# Patient Record
Sex: Female | Born: 1960 | Race: Black or African American | Hispanic: No | Marital: Single | Smoking: Former smoker
Health system: Southern US, Community
[De-identification: ages and names within clinical notes are randomized; demographics above are authoritative.]

## PROBLEM LIST (undated history)

## (undated) DIAGNOSIS — A4901 Methicillin susceptible Staphylococcus aureus infection, unspecified site: Secondary | ICD-10-CM

## (undated) DIAGNOSIS — R51 Headache: Secondary | ICD-10-CM

## (undated) DIAGNOSIS — I10 Essential (primary) hypertension: Secondary | ICD-10-CM

## (undated) DIAGNOSIS — E7800 Pure hypercholesterolemia, unspecified: Secondary | ICD-10-CM

## (undated) DIAGNOSIS — E11610 Type 2 diabetes mellitus with diabetic neuropathic arthropathy: Secondary | ICD-10-CM

## (undated) DIAGNOSIS — Z5189 Encounter for other specified aftercare: Secondary | ICD-10-CM

## (undated) DIAGNOSIS — D649 Anemia, unspecified: Secondary | ICD-10-CM

## (undated) HISTORY — DX: Essential (primary) hypertension: I10

## (undated) HISTORY — DX: Type 2 diabetes mellitus with diabetic neuropathic arthropathy: E11.610

## (undated) HISTORY — DX: Methicillin susceptible Staphylococcus aureus infection, unspecified site: A49.01

## (undated) HISTORY — DX: Anemia, unspecified: D64.9

## (undated) HISTORY — PX: TONSILLECTOMY: SUR1361

---

## 2004-10-16 ENCOUNTER — Emergency Department: Payer: Self-pay | Admitting: Emergency Medicine

## 2008-08-23 ENCOUNTER — Ambulatory Visit (HOSPITAL_COMMUNITY): Admission: RE | Admit: 2008-08-23 | Discharge: 2008-08-24 | Payer: Self-pay | Admitting: Ophthalmology

## 2009-10-04 ENCOUNTER — Ambulatory Visit (HOSPITAL_COMMUNITY): Admission: RE | Admit: 2009-10-04 | Discharge: 2009-10-04 | Payer: Self-pay | Admitting: Podiatry

## 2010-03-18 ENCOUNTER — Encounter: Payer: Self-pay | Admitting: Podiatry

## 2010-03-28 HISTORY — PX: FOOT SURGERY: SHX648

## 2010-06-04 LAB — BASIC METABOLIC PANEL
CO2: 26 mEq/L (ref 19–32)
Calcium: 9.6 mg/dL (ref 8.4–10.5)
GFR calc Af Amer: >60 mL/min (ref >60)
GFR calc non Af Amer: >60 mL/min (ref >60)
Glucose, Bld: 118 mg/dL — ABNORMAL HIGH (ref 70–99)
Potassium: 4.3 mEq/L (ref 3.5–5.1)
Sodium: 138 mEq/L (ref 135–145)

## 2010-06-04 LAB — GLUCOSE, CAPILLARY
Glucose-Capillary: 143 mg/dL — ABNORMAL HIGH (ref 70–99)
Glucose-Capillary: 171 mg/dL — ABNORMAL HIGH (ref 70–99)
Glucose-Capillary: 275 mg/dL — ABNORMAL HIGH (ref 70–99)

## 2010-06-04 LAB — CBC
HCT: 34 % — ABNORMAL LOW (ref 36.0–46.0)
Hemoglobin: 11.5 g/dL — ABNORMAL LOW (ref 12.0–15.0)
RBC: 3.77 MIL/uL — ABNORMAL LOW (ref 3.87–5.11)

## 2010-07-10 NOTE — Op Note (Signed)
Raven Watson, Raven Watson                ACCOUNT NO.:  [id number]   MEDICAL RECORD NO.:  [id number]          PATIENT TYPE:  OIB   LOCATION:  5127                         FACILITY:  [facility]   PHYSICIAN:  Beulah Gandy. Ashley Royalty, M.D. DATE OF BIRTH:  1960-10-06   DATE OF PROCEDURE:  08/23/2008  DATE OF DISCHARGE:                               OPERATIVE REPORT   ADMISSION DIAGNOSES:  Left eye proliferative diabetic retinopathy, right  eye proliferative diabetic retinopathy, vitreous hemorrhage, traction  retinal detachment.   PROCEDURES:  Left eye, panretinal photocoagulation with the indirect  ophthalmoscope laser; right eye, pars plana vitrectomy with membrane  peel; endocautery; gas fluid exchange; and retinal photocoagulation.   SURGEON:  Beulah Gandy. Ashley Royalty, MD   ASSISTANT:  Charyl Dancer, RN   ANESTHESIA:  General.   DETAILS:  After proper endotracheal anesthesia, attention was carried to  the left eye where 1226 laser burns were placed around the retinal  periphery.  The power was 460 milliwatts, 1000 microns each, and 0.1  seconds each.  The eye was treated with ointment and taped shut.  Attention was then carried to the right eye where usual prep and drape  was performed, 25-gauge trocars were placed at 8:10 and 2 o'clock.  Infusion at 8 o'clock.  Provisc placed on the corneal surface.  25-gauge  cutter and lighted pick were used to engage the vitreous and began  vitrectomy in the mid vitreous cavity.  Large clots of blood were  encountered, carefully removed under low suction and rapid cutting.  Membranes were extending from the disk nasally, temporally, and along  the upper arcade.  These membranes were incised with vitreous scissors  and removed with the vitreous cutter.  Forceps were used to grasp the  membrane and peel it from its attachments to the disk and the macula.  Once the entire membrane was removed, additional blood was vacuumed from  the surface of the retina.  The vitrectomy  was carried out to the  vitreous base for 360 degrees where additional cutting was performed to  shape the vitreous base.  Once the entire vitreous base was cleansed,  the endocautery was placed for hemostasis on several vitreoretinal  stumps.  Total hemostasis was obtained.  The extrusion cannula was used  to remove blood from the retinal surface.  The endolaser was positioned  in the eye, 1216 burns were placed around the retinal periphery.  The  power was 1000 milliwatts, 1000 microns each, and 0.1 seconds each.  A  30% gas-fluid exchange was then carried out.  The instruments were  removed from the eye, and the trocars were carefully removed.  The  wounds were tested and found to be tight.  Polymyxin and gentamicin were  irrigated at [place] space.  Atropine solution was applied, Decadron 10 mg  was injected to the lower subconjunctival space.  Polysporin ophthalmic  ointment, patch, and shield were placed.  The closing pressure was 10  with a Barraquer tonometer.  Complications, none.  Duration, 1 hour.  The patient was awakened and taken to recovery in satisfactory  condition.  Beulah Gandy. Ashley Royalty, M.D.  Electronically Signed    JDM/MEDQ  D:  08/23/2008  T:  08/24/2008  Job:  [id number]

## 2010-07-11 ENCOUNTER — Emergency Department (HOSPITAL_COMMUNITY)
Admission: EM | Admit: 2010-07-11 | Discharge: 2010-07-11 | Disposition: A | Discharge status: Home / Self Care | Payer: BC Managed Care – PPO | Attending: Emergency Medicine | Admitting: Emergency Medicine

## 2010-07-11 DIAGNOSIS — E785 Hyperlipidemia, unspecified: Secondary | ICD-10-CM | POA: Insufficient documentation

## 2010-07-11 DIAGNOSIS — Z7982 Long term (current) use of aspirin: Secondary | ICD-10-CM | POA: Insufficient documentation

## 2010-07-11 DIAGNOSIS — Z794 Long term (current) use of insulin: Secondary | ICD-10-CM | POA: Insufficient documentation

## 2010-07-11 DIAGNOSIS — E119 Type 2 diabetes mellitus without complications: Secondary | ICD-10-CM | POA: Insufficient documentation

## 2010-07-11 DIAGNOSIS — R071 Chest pain on breathing: Secondary | ICD-10-CM | POA: Insufficient documentation

## 2010-07-11 DIAGNOSIS — I10 Essential (primary) hypertension: Secondary | ICD-10-CM | POA: Insufficient documentation

## 2010-07-11 DIAGNOSIS — M25519 Pain in unspecified shoulder: Secondary | ICD-10-CM | POA: Insufficient documentation

## 2010-07-11 DIAGNOSIS — Z79899 Other long term (current) drug therapy: Secondary | ICD-10-CM | POA: Insufficient documentation

## 2010-08-26 ENCOUNTER — Inpatient Hospital Stay (HOSPITAL_COMMUNITY)
Admission: EM | Admit: 2010-08-26 | Discharge: 2010-08-31 | DRG: 415 | Disposition: A | Discharge status: Home Health | Payer: BC Managed Care – PPO | Source: Other Acute Inpatient Hospital | Attending: Internal Medicine | Admitting: Internal Medicine

## 2010-08-26 ENCOUNTER — Emergency Department (HOSPITAL_COMMUNITY): Payer: BC Managed Care – PPO

## 2010-08-26 ENCOUNTER — Emergency Department (HOSPITAL_COMMUNITY)
Admission: EM | Admit: 2010-08-26 | Discharge: 2010-08-26 | Disposition: A | Discharge status: Nursing Home (Includes ICF) | Payer: BC Managed Care – PPO | Source: Home / Self Care | Attending: Emergency Medicine | Admitting: Emergency Medicine

## 2010-08-26 ENCOUNTER — Inpatient Hospital Stay (HOSPITAL_COMMUNITY): Payer: BC Managed Care – PPO

## 2010-08-26 DIAGNOSIS — E785 Hyperlipidemia, unspecified: Secondary | ICD-10-CM | POA: Insufficient documentation

## 2010-08-26 DIAGNOSIS — A419 Sepsis, unspecified organism: Secondary | ICD-10-CM | POA: Insufficient documentation

## 2010-08-26 DIAGNOSIS — IMO0001 Reserved for inherently not codable concepts without codable children: Secondary | ICD-10-CM

## 2010-08-26 DIAGNOSIS — R112 Nausea with vomiting, unspecified: Secondary | ICD-10-CM | POA: Insufficient documentation

## 2010-08-26 DIAGNOSIS — D509 Iron deficiency anemia, unspecified: Secondary | ICD-10-CM | POA: Diagnosis present

## 2010-08-26 DIAGNOSIS — A4901 Methicillin susceptible Staphylococcus aureus infection, unspecified site: Secondary | ICD-10-CM

## 2010-08-26 DIAGNOSIS — N289 Disorder of kidney and ureter, unspecified: Secondary | ICD-10-CM | POA: Diagnosis present

## 2010-08-26 DIAGNOSIS — I10 Essential (primary) hypertension: Secondary | ICD-10-CM | POA: Insufficient documentation

## 2010-08-26 DIAGNOSIS — E871 Hypo-osmolality and hyponatremia: Secondary | ICD-10-CM | POA: Diagnosis present

## 2010-08-26 DIAGNOSIS — E1149 Type 2 diabetes mellitus with other diabetic neurological complication: Secondary | ICD-10-CM | POA: Diagnosis present

## 2010-08-26 DIAGNOSIS — Z7982 Long term (current) use of aspirin: Secondary | ICD-10-CM

## 2010-08-26 DIAGNOSIS — I319 Disease of pericardium, unspecified: Secondary | ICD-10-CM

## 2010-08-26 DIAGNOSIS — E1165 Type 2 diabetes mellitus with hyperglycemia: Secondary | ICD-10-CM | POA: Diagnosis present

## 2010-08-26 DIAGNOSIS — E119 Type 2 diabetes mellitus without complications: Secondary | ICD-10-CM | POA: Insufficient documentation

## 2010-08-26 DIAGNOSIS — E7800 Pure hypercholesterolemia, unspecified: Secondary | ICD-10-CM | POA: Diagnosis present

## 2010-08-26 DIAGNOSIS — A4101 Sepsis due to Methicillin susceptible Staphylococcus aureus: Principal | ICD-10-CM | POA: Diagnosis present

## 2010-08-26 DIAGNOSIS — R609 Edema, unspecified: Secondary | ICD-10-CM

## 2010-08-26 DIAGNOSIS — I959 Hypotension, unspecified: Secondary | ICD-10-CM | POA: Diagnosis present

## 2010-08-26 DIAGNOSIS — Z794 Long term (current) use of insulin: Secondary | ICD-10-CM | POA: Insufficient documentation

## 2010-08-26 DIAGNOSIS — L03119 Cellulitis of unspecified part of limb: Secondary | ICD-10-CM | POA: Diagnosis present

## 2010-08-26 DIAGNOSIS — D649 Anemia, unspecified: Secondary | ICD-10-CM | POA: Insufficient documentation

## 2010-08-26 DIAGNOSIS — R5381 Other malaise: Secondary | ICD-10-CM | POA: Diagnosis present

## 2010-08-26 DIAGNOSIS — IMO0002 Reserved for concepts with insufficient information to code with codable children: Secondary | ICD-10-CM | POA: Diagnosis present

## 2010-08-26 DIAGNOSIS — Z5189 Encounter for other specified aftercare: Secondary | ICD-10-CM

## 2010-08-26 DIAGNOSIS — L02619 Cutaneous abscess of unspecified foot: Secondary | ICD-10-CM | POA: Diagnosis present

## 2010-08-26 DIAGNOSIS — M9080 Osteopathy in diseases classified elsewhere, unspecified site: Secondary | ICD-10-CM | POA: Diagnosis present

## 2010-08-26 DIAGNOSIS — R1310 Dysphagia, unspecified: Secondary | ICD-10-CM | POA: Diagnosis present

## 2010-08-26 DIAGNOSIS — Z79899 Other long term (current) drug therapy: Secondary | ICD-10-CM | POA: Insufficient documentation

## 2010-08-26 DIAGNOSIS — M869 Osteomyelitis, unspecified: Secondary | ICD-10-CM | POA: Diagnosis present

## 2010-08-26 HISTORY — DX: Encounter for other specified aftercare: Z51.89

## 2010-08-26 HISTORY — DX: Methicillin susceptible Staphylococcus aureus infection, unspecified site: A49.01

## 2010-08-26 HISTORY — DX: Reserved for inherently not codable concepts without codable children: IMO0001

## 2010-08-26 LAB — BASIC METABOLIC PANEL
CO2: 25 mEq/L (ref 19–32)
CO2: 25 mEq/L (ref 19–32)
Calcium: 11 mg/dL — ABNORMAL HIGH (ref 8.4–10.5)
Chloride: 104 mEq/L (ref 96–112)
Creatinine, Ser: 1.2 mg/dL — ABNORMAL HIGH (ref 0.50–1.10)
GFR calc non Af Amer: 49 mL/min — ABNORMAL LOW (ref >60)
Glucose, Bld: 163 mg/dL — ABNORMAL HIGH (ref 70–99)
Potassium: 3.8 mEq/L (ref 3.5–5.1)
Sodium: 136 mEq/L (ref 135–145)

## 2010-08-26 LAB — URINE MICROSCOPIC-ADD ON

## 2010-08-26 LAB — DIC (DISSEMINATED INTRAVASCULAR COAGULATION)PANEL
INR: 1.3 (ref 0.00–1.49)
Prothrombin Time: 16.4 seconds — ABNORMAL HIGH (ref 11.6–15.2)

## 2010-08-26 LAB — VITAMIN B12
Vitamin B-12: 463 pg/mL (ref 211–911)
Vitamin B-12: 320 pg/mL (ref 211–911)

## 2010-08-26 LAB — DIFFERENTIAL
Basophils Relative: 0 % (ref 0–1)
Eosinophils Relative: 0 % (ref 0–5)
Lymphocytes Relative: 6 % — ABNORMAL LOW (ref 12–46)
Monocytes Relative: 4 % (ref 3–12)
Neutrophils Relative %: 90 % — ABNORMAL HIGH (ref 43–77)
WBC Morphology: INCREASED

## 2010-08-26 LAB — CBC
Hemoglobin: 6 g/dL — CL (ref 12.0–15.0)
Hemoglobin: 6.4 g/dL — CL (ref 12.0–15.0)
MCHC: 30.6 g/dL (ref 30.0–36.0)
MCHC: 32.5 g/dL (ref 30.0–36.0)
RBC: 2.66 MIL/uL — ABNORMAL LOW (ref 3.87–5.11)
RBC: 2.62 MIL/uL — ABNORMAL LOW (ref 3.87–5.11)
WBC: 13.1 10*3/uL — ABNORMAL HIGH (ref 4.0–10.5)
WBC: 16.8 10*3/uL — ABNORMAL HIGH (ref 4.0–10.5)

## 2010-08-26 LAB — HEMOGLOBIN A1C
Hgb A1c MFr Bld: 12.6 % — ABNORMAL HIGH (ref <5.7)
Mean Plasma Glucose: 315 mg/dL — ABNORMAL HIGH (ref <117)

## 2010-08-26 LAB — IRON AND TIBC
Iron: <10 ug/dL — ABNORMAL LOW (ref 42–135)
UIBC: 187 ug/dL
UIBC: 156 ug/dL

## 2010-08-26 LAB — LACTATE DEHYDROGENASE
LDH: 249 U/L (ref 94–250)
LDH: 227 U/L (ref 94–250)

## 2010-08-26 LAB — CARDIAC PANEL(CRET KIN+CKTOT+MB+TROPI)
CK, MB: 0.8 ng/mL (ref 0.3–4.0)
Relative Index: INVALID (ref 0.0–2.5)
Total CK: 20 U/L (ref 7–177)
Troponin I: <0.3 ng/mL (ref <0.30)

## 2010-08-26 LAB — MRSA PCR SCREENING: MRSA by PCR: NEGATIVE

## 2010-08-26 LAB — CROSSMATCH
Antibody Screen: NEGATIVE
Unit division: 0

## 2010-08-26 LAB — FIBRINOGEN: Fibrinogen: 695 mg/dL — ABNORMAL HIGH (ref 204–475)

## 2010-08-26 LAB — HEPATIC FUNCTION PANEL
ALT: 11 U/L (ref 0–35)
AST: 20 U/L (ref 0–37)
Albumin: 2 g/dL — ABNORMAL LOW (ref 3.5–5.2)
Alkaline Phosphatase: 177 U/L — ABNORMAL HIGH (ref 39–117)
Total Bilirubin: 0.3 mg/dL (ref 0.3–1.2)

## 2010-08-26 LAB — URINALYSIS, ROUTINE W REFLEX MICROSCOPIC
Glucose, UA: >1000 mg/dL — AB
Leukocytes, UA: NEGATIVE
Protein, ur: 30 mg/dL — AB
Specific Gravity, Urine: <1.005 — ABNORMAL LOW (ref 1.005–1.030)
pH: 6 (ref 5.0–8.0)

## 2010-08-26 LAB — TSH: TSH: 0.774 u[IU]/mL (ref 0.350–4.500)

## 2010-08-26 LAB — GLUCOSE, CAPILLARY
Glucose-Capillary: 505 mg/dL — ABNORMAL HIGH (ref 70–99)
Glucose-Capillary: 531 mg/dL — ABNORMAL HIGH (ref 70–99)
Glucose-Capillary: 213 mg/dL — ABNORMAL HIGH (ref 70–99)

## 2010-08-26 LAB — FERRITIN: Ferritin: 1291 ng/mL — ABNORMAL HIGH (ref 10–291)

## 2010-08-26 LAB — PREPARE RBC (CROSSMATCH)

## 2010-08-26 LAB — SEDIMENTATION RATE: Sed Rate: >140 mm/hr — ABNORMAL HIGH (ref 0–22)

## 2010-08-26 LAB — FOLATE: Folate: 5.1 ng/mL

## 2010-08-26 LAB — POCT I-STAT 4, (NA,K, GLUC, HGB,HCT)

## 2010-08-26 LAB — APTT: aPTT: 35 seconds (ref 24–37)

## 2010-08-26 LAB — ABO/RH: ABO/RH(D): O POS

## 2010-08-26 LAB — PROTIME-INR
INR: 1.35 (ref 0.00–1.49)
Prothrombin Time: 16.9 seconds — ABNORMAL HIGH (ref 11.6–15.2)

## 2010-08-26 IMAGING — CR DG FOOT COMPLETE 3+V*R*
3 series · 3 of 3 positions shown · non-contrast
Comparison: [DATE]

CLINICAL DATA: Surgery in [REDACTED].  Nausea vomiting and diarrhea
for 3 days.  Draining foot for 1 month.  Elevated white blood cell
count.

RIGHT FOOT COMPLETE - 3+ VIEW

[view not recorded (1 of 3)]
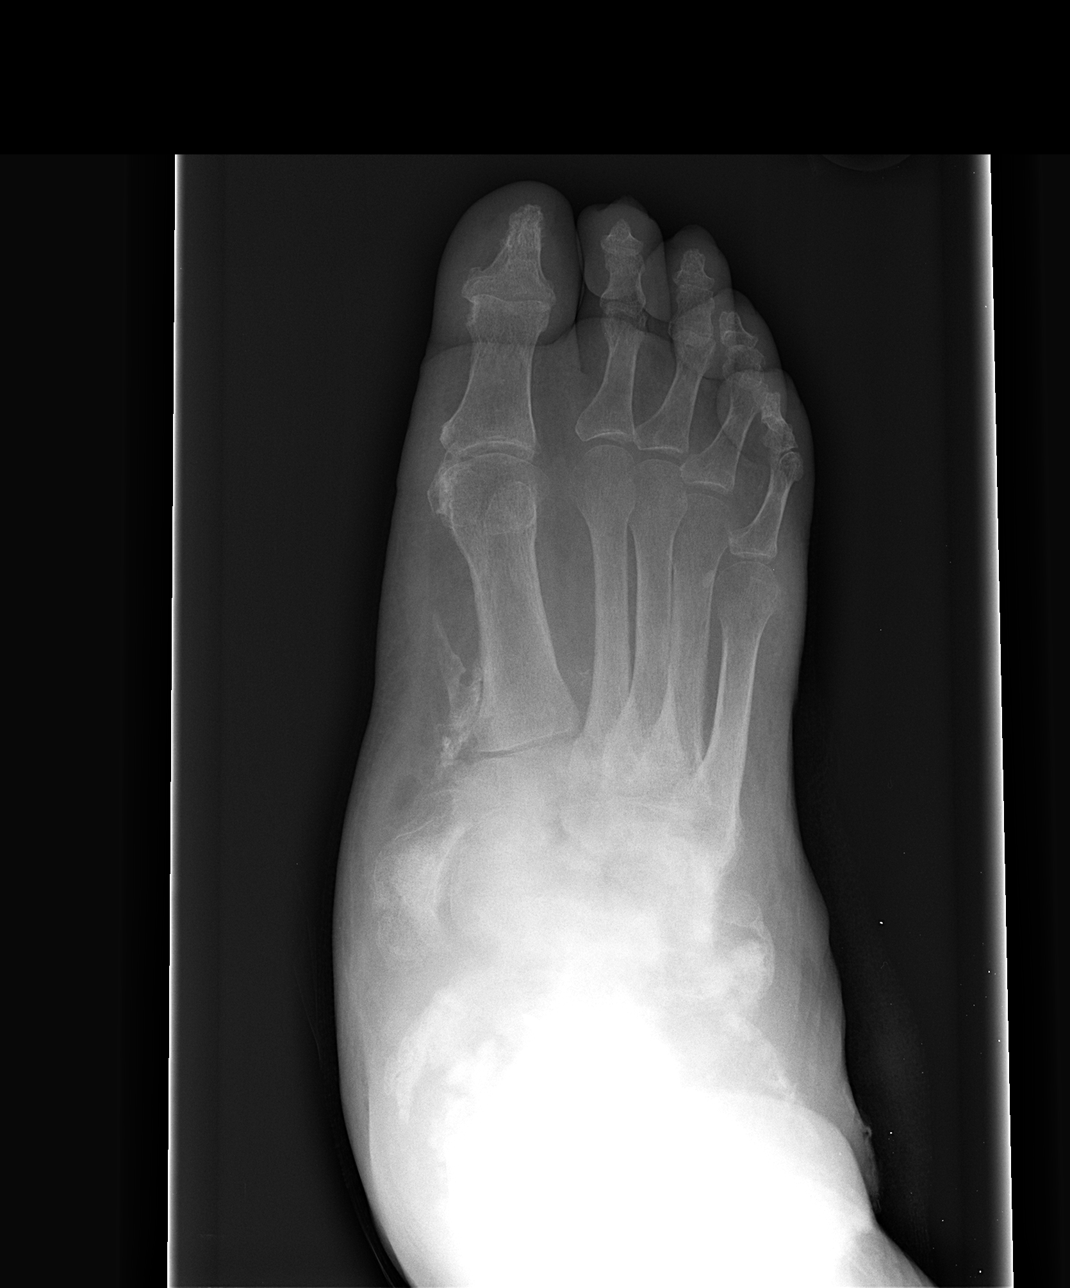

[view not recorded (2 of 3)]
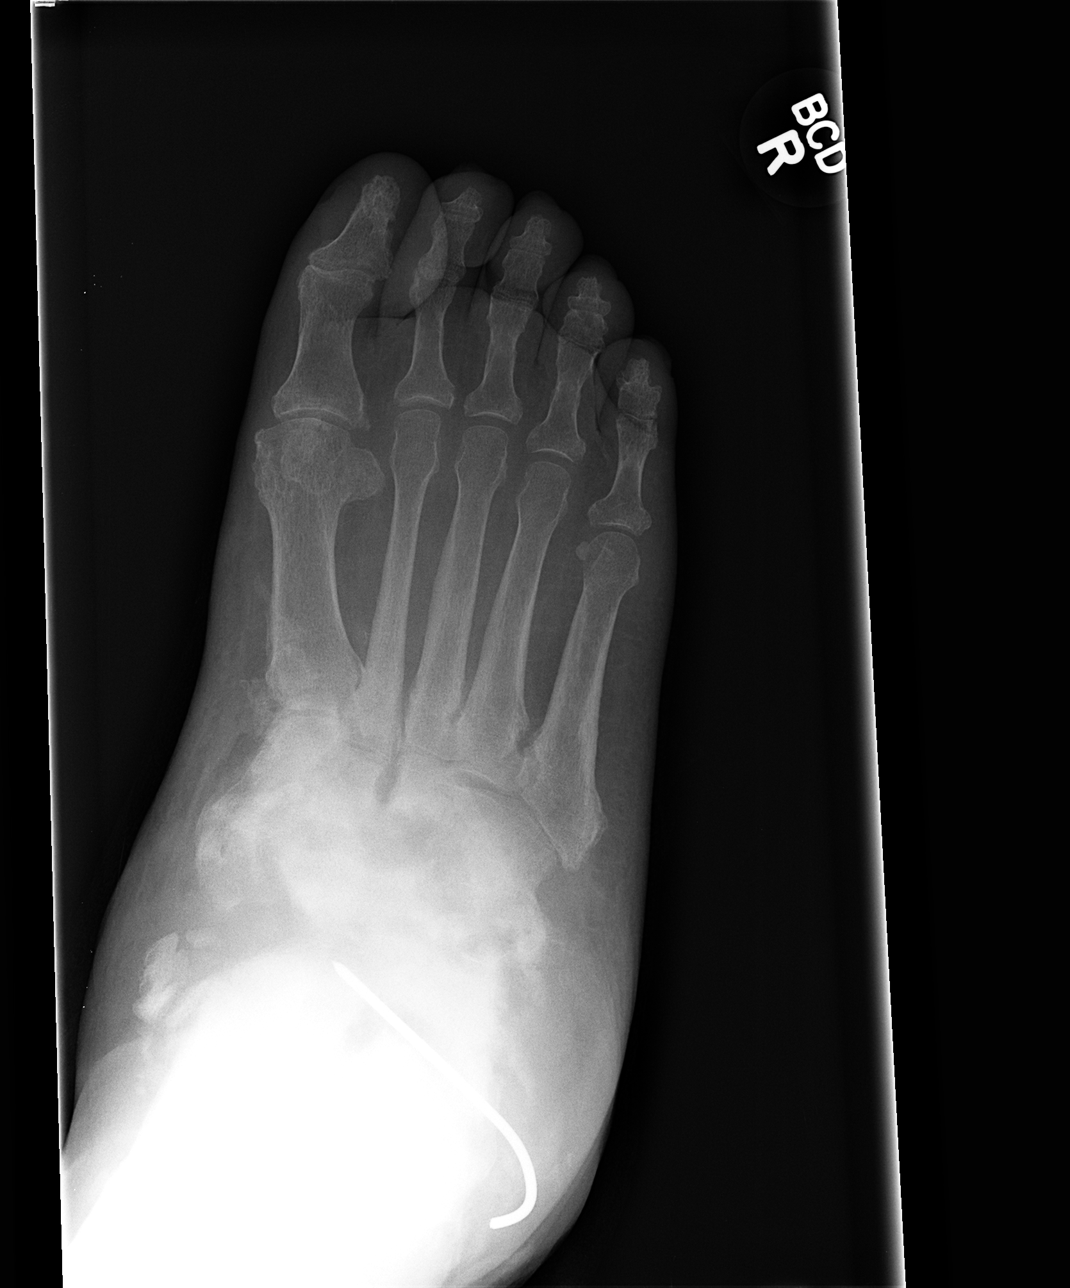

[view not recorded (3 of 3)]
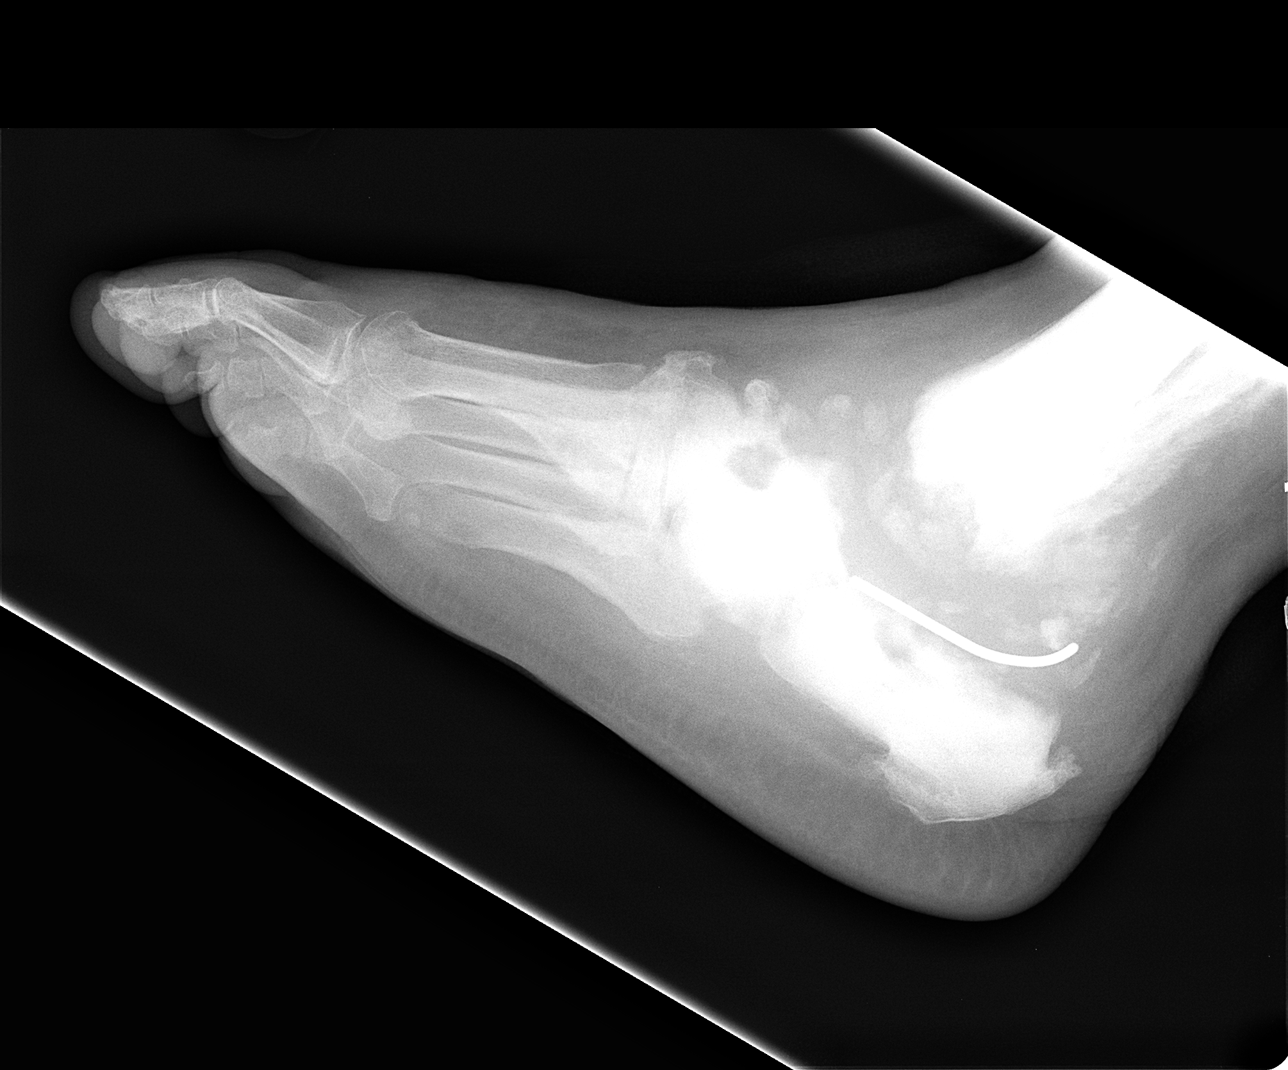

[3 of 3 positions shown; findings below may reference images not displayed]

FINDINGS: Diffuse osteopenia.  Extensive heterotopic ossification.
Diffuse soft tissue swelling.  Progression of charcot arthropathy
about the midfoot and hindfoot.  A surgical nail is identified
within the hind foot.  There is absence of bone in this area which
could represent  osteolysis.  There is diffuse sclerosis involving
the calcaneus, cuboid, and distal talus.  No focal soft tissue
defect identified.  No soft tissue gas.  No radiopaque foreign
object.
IMPRESSION: 1.  Diffuse soft tissue swelling without soft tissue gas or
radiopaque foreign object.
2.  Surgical changes in the hindfoot.  Combination of presumed
osteolysis and diffuse bony sclerosis is nonspecific.
Possibilities include progressive charcot arthropathy or
infection/osteomyelitis.  Contrast enhanced MRI should be
considered for further characterization.

## 2010-08-27 LAB — GLUCOSE, CAPILLARY
Glucose-Capillary: 564 mg/dL (ref 70–99)
Glucose-Capillary: 154 mg/dL — ABNORMAL HIGH (ref 70–99)
Glucose-Capillary: 347 mg/dL — ABNORMAL HIGH (ref 70–99)

## 2010-08-27 LAB — COMPREHENSIVE METABOLIC PANEL
AST: 67 U/L — ABNORMAL HIGH (ref 0–37)
Albumin: 1.4 g/dL — ABNORMAL LOW (ref 3.5–5.2)
Alkaline Phosphatase: 115 U/L (ref 39–117)
Chloride: 103 mEq/L (ref 96–112)
Potassium: 4.1 mEq/L (ref 3.5–5.1)
Total Bilirubin: 0.4 mg/dL (ref 0.3–1.2)

## 2010-08-27 LAB — CARDIAC PANEL(CRET KIN+CKTOT+MB+TROPI)
Relative Index: INVALID (ref 0.0–2.5)
Total CK: 18 U/L (ref 7–177)
Troponin I: <0.3 ng/mL (ref <0.30)

## 2010-08-27 LAB — CBC
HCT: 20.4 % — ABNORMAL LOW (ref 36.0–46.0)
Hemoglobin: 7 g/dL — ABNORMAL LOW (ref 12.0–15.0)
MCH: 26.1 pg (ref 26.0–34.0)
MCHC: 34.3 g/dL (ref 30.0–36.0)
MCV: 76.1 fL — ABNORMAL LOW (ref 78.0–100.0)
Platelets: 370 10*3/uL (ref 150–400)
RBC: 2.68 MIL/uL — ABNORMAL LOW (ref 3.87–5.11)
RDW: 18.4 % — ABNORMAL HIGH (ref 11.5–15.5)
WBC: 17.5 10*3/uL — ABNORMAL HIGH (ref 4.0–10.5)

## 2010-08-27 LAB — PREPARE RBC (CROSSMATCH)

## 2010-08-27 LAB — FOLATE RBC: RBC Folate: 700 ng/mL — ABNORMAL HIGH (ref >=366)

## 2010-08-27 LAB — TECHNOLOGIST SMEAR REVIEW: Path Review: INCREASED

## 2010-08-28 LAB — COMPREHENSIVE METABOLIC PANEL
AST: 49 U/L — ABNORMAL HIGH (ref 0–37)
Alkaline Phosphatase: 162 U/L — ABNORMAL HIGH (ref 39–117)
BUN: 30 mg/dL — ABNORMAL HIGH (ref 6–23)
CO2: 25 mEq/L (ref 19–32)
Chloride: 102 mEq/L (ref 96–112)
Creatinine, Ser: 0.95 mg/dL (ref 0.50–1.10)
GFR calc non Af Amer: >60 mL/min (ref >60)
Potassium: 3.8 mEq/L (ref 3.5–5.1)
Total Bilirubin: 0.3 mg/dL (ref 0.3–1.2)

## 2010-08-28 LAB — GLUCOSE, CAPILLARY
Glucose-Capillary: 101 mg/dL — ABNORMAL HIGH (ref 70–99)
Glucose-Capillary: 169 mg/dL — ABNORMAL HIGH (ref 70–99)
Glucose-Capillary: 139 mg/dL — ABNORMAL HIGH (ref 70–99)

## 2010-08-28 LAB — CBC
HCT: 21.9 % — ABNORMAL LOW (ref 36.0–46.0)
MCV: 77.4 fL — ABNORMAL LOW (ref 78.0–100.0)
RBC: 2.83 MIL/uL — ABNORMAL LOW (ref 3.87–5.11)
WBC: 16.7 10*3/uL — ABNORMAL HIGH (ref 4.0–10.5)

## 2010-08-28 LAB — HAPTOGLOBIN: Haptoglobin: 540 mg/dL — ABNORMAL HIGH (ref 30–200)

## 2010-08-28 LAB — PREPARE RBC (CROSSMATCH)

## 2010-08-29 LAB — CBC
HCT: 25.8 % — ABNORMAL LOW (ref 36.0–46.0)
MCHC: 32.9 g/dL (ref 30.0–36.0)
Platelets: 382 10*3/uL (ref 150–400)
RDW: 18.9 % — ABNORMAL HIGH (ref 11.5–15.5)
WBC: 17 10*3/uL — ABNORMAL HIGH (ref 4.0–10.5)

## 2010-08-29 LAB — CROSSMATCH
ABO/RH(D): O POS
Antibody Screen: NEGATIVE
Unit division: 0
Unit division: 0
Unit division: 0
Unit division: 0
Unit division: 0

## 2010-08-29 LAB — HAPTOGLOBIN: Haptoglobin: 573 mg/dL — ABNORMAL HIGH (ref 30–200)

## 2010-08-29 LAB — BASIC METABOLIC PANEL
BUN: 16 mg/dL (ref 6–23)
Chloride: 102 mEq/L (ref 96–112)
GFR calc Af Amer: >60 mL/min (ref >60)
GFR calc non Af Amer: >60 mL/min (ref >60)
Potassium: 3.7 mEq/L (ref 3.5–5.1)
Sodium: 133 mEq/L — ABNORMAL LOW (ref 135–145)

## 2010-08-29 LAB — WOUND CULTURE

## 2010-08-29 LAB — GLUCOSE, CAPILLARY
Glucose-Capillary: 128 mg/dL — ABNORMAL HIGH (ref 70–99)
Glucose-Capillary: 240 mg/dL — ABNORMAL HIGH (ref 70–99)

## 2010-08-30 DIAGNOSIS — R609 Edema, unspecified: Secondary | ICD-10-CM

## 2010-08-30 DIAGNOSIS — A4901 Methicillin susceptible Staphylococcus aureus infection, unspecified site: Secondary | ICD-10-CM

## 2010-08-30 DIAGNOSIS — D649 Anemia, unspecified: Secondary | ICD-10-CM

## 2010-08-30 DIAGNOSIS — M869 Osteomyelitis, unspecified: Secondary | ICD-10-CM

## 2010-08-30 DIAGNOSIS — E11610 Type 2 diabetes mellitus with diabetic neuropathic arthropathy: Secondary | ICD-10-CM

## 2010-08-30 DIAGNOSIS — B951 Streptococcus, group B, as the cause of diseases classified elsewhere: Secondary | ICD-10-CM

## 2010-08-30 HISTORY — DX: Anemia, unspecified: D64.9

## 2010-08-30 HISTORY — DX: Type 2 diabetes mellitus with diabetic neuropathic arthropathy: E11.610

## 2010-08-30 LAB — CBC
HCT: 28.1 % — ABNORMAL LOW (ref 36.0–46.0)
Hemoglobin: 9 g/dL — ABNORMAL LOW (ref 12.0–15.0)
MCHC: 32 g/dL (ref 30.0–36.0)
WBC: 16.2 10*3/uL — ABNORMAL HIGH (ref 4.0–10.5)

## 2010-08-30 LAB — GLUCOSE, CAPILLARY
Glucose-Capillary: 262 mg/dL — ABNORMAL HIGH (ref 70–99)
Glucose-Capillary: 98 mg/dL (ref 70–99)
Glucose-Capillary: 302 mg/dL — ABNORMAL HIGH (ref 70–99)
Glucose-Capillary: 299 mg/dL — ABNORMAL HIGH (ref 70–99)

## 2010-08-30 LAB — BASIC METABOLIC PANEL
Calcium: 8.8 mg/dL (ref 8.4–10.5)
GFR calc Af Amer: 60 mL/min — ABNORMAL LOW (ref >60)
GFR calc non Af Amer: 50 mL/min — ABNORMAL LOW (ref >60)
Potassium: 4 mEq/L (ref 3.5–5.1)
Sodium: 133 mEq/L — ABNORMAL LOW (ref 135–145)

## 2010-08-30 LAB — CULTURE, ROUTINE-ABSCESS

## 2010-08-31 LAB — GLUCOSE, CAPILLARY: Glucose-Capillary: 164 mg/dL — ABNORMAL HIGH (ref 70–99)

## 2010-08-31 NOTE — Op Note (Signed)
Raven Watson, Raven Watson                ACCOUNT NO.:  [id number]  MEDICAL RECORD NO.:  [id number]  LOCATION:  3315                         FACILITY:  [facility]  PHYSICIAN:  Vanita Panda. Magnus Ivan, M.D.DATE OF BIRTH:  03/11/1960  DATE OF PROCEDURE:  08/26/2010 DATE OF DISCHARGE:                              OPERATIVE REPORT   PREOPERATIVE DIAGNOSES: 1. Right foot and ankle abscess with overwhelming infection of soft     tissue, muscle, and bone. 2. Sepsis.  POSTOPERATIVE DIAGNOSES: 1. Right foot and ankle abscess with overwhelming infection of soft     tissue, muscle, and bone. 2. Sepsis.  PROCEDURE: 1. Irrigation and debridement of right foot and ankle abscess     including skin, soft tissue, tendon, muscle, and bone. 2. Removal of retained pin, right foot.  FINDINGS:  Large area gross purulence in reminiscence of right foot, tibia, talar and subtalar joint with gross purulence in necrotic soft tissue and bone, cultures pending. SURGEON:  Vanita Panda. Magnus Ivan, MD  ANESTHESIA:  Right popliteal block.  BLOOD LOSS:  100 mL.  COMPLICATIONS:  None.  INDICATIONS:  Ms. 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  This has been going on for some time.  She said some type of operation previously by her podiatrist back in February.  She then developed recent drainage from the right ankle wound with gross purulence.  Apparently she has been at [facility] for few days and then was transferred down here to the Hospitalist Service.  Orthopedic surgery was then consulted to address the foot.  X-rays of the foot compared to films from August of 2011 showed severe bony destruction all throughout the mid foot and hind foot.  Some of these are postsurgical changes and some of these are Charcot changes but overall it did show evidence of osteomyelitis as well and a retained pin.  Due to the grossly purulent drainage from her ankle and her  becoming septic appearing, we have elected to at least temporize things with opening her ankle up on the right side and washing out as best as possible.  I think at some point she will likely need a below-knee amputation.  PROCEDURE DESCRIPTION:  After informed consent was obtained, the appropriate right ankle was marked.  She was brought to the operating room and placed supine on the operating table.  Popliteal block was obtained and her right foot and ankle and leg were prepped and draped with Betadine scrub and paint.  Time-out was called to identify the correct patient and right foot and ankle.  I then made a incision around the previous incision where gross purulence was pouring out of the wound.  There was a large dead space in the foot for fluid collection over the large abscess.  Using a rongeur and a knife, I was able to dbride skin, soft tissue, tendon, muscle, and bone in this area.  I then used 6 liters of pulsatile lavage solution to completely lavage out this area.  I then loosely closed the skin with interrupted 2-0 nylon suture and placed Xeroform and well-padded compressive dressing with the understanding that she will continue  to accumulate fluid in this space. She was taken to the recovery room in stable condition. Postoperatively, they will hopefully be able to get her stabilize medically for potential further surgical intervention down the road which may even include a below-knee amputation.     Vanita Panda. Magnus Ivan, M.D.     CYB/MEDQ  D:  08/26/2010  T:  08/27/2010  Job:  [id number]  Electronically Signed by Doneen Poisson M.D. on 08/31/2010 06:30:43 PM

## 2010-09-01 LAB — CULTURE, BLOOD (ROUTINE X 2)
Culture  Setup Time: 201207011757
Culture  Setup Time: 201207011757
Culture: NO GROWTH
Culture: NO GROWTH

## 2010-09-01 LAB — ANAEROBIC CULTURE

## 2010-09-01 NOTE — Discharge Summary (Signed)
NAMEMADALINA, Watson NO.:  [id number]  MEDICAL RECORD NO.:  [id number]  LOCATION:  3315                         FACILITY:  [facility]  PHYSICIAN:  Isidor Holts, M.D.  DATE OF BIRTH:  [birth year]  DATE OF ADMISSION:  08/26/2010 DATE OF DISCHARGE:  08/30/2010                        DISCHARGE SUMMARY - REFERRING   PRIMARY MD:  Dr.  Wandra Watson, Family Medicine, [facility], [place], [place].  PRIMARY ENDOCRINOLOGIST:  Dr. Delano Watson at [facility].  PRIMARY PODIATRIST:  Dr. Cindra Watson at [facility], [address], [place], [place] [address].  DISCHARGE DIAGNOSES: 1. History of Charcot joint, right foot, status post surgery with thin     wire placement in February 2012. Hardware removed in April 2012. 2. MSSA/group B Streptococcus agalactiae infection/abscess and     osteomyelitis complicating #1. Status post I and D, with placement of antibiotic beads on      August 28, 2010. 3. Early sepsis, secondary to #2. 4. Severe microcytic anemia. Required transfusion 3 units of PRBC. 5. Hypertension. 6. Uncontrolled insulin-requiring type 2 diabetes mellitus.  DISCHARGE MEDICATIONS: 1. Ancef 2 g IV 8 hourly for 37 days, from August 31, 2010. 2. Hydrocodone/APAP (5/325 mg one tablet p.o. p.r.n. q.4 h.) for pain.     A total of #90 pills have been dispensed. 3. NovoLog insulin per sliding scale, as follows:  For CBG 70-120, no     insulin; CBG 121-150, 1 unit; CBG 151-200, 2 units; CBG 201-250, 3     units; CBG 251-300, 5 units; CBG 301-350, 7 units; CBG 351-400, 9 units. 4. Enalapril 10 mg p.o. daily (she was on 40 mg p.o. daily). 5. Lantus insulin 25 units, subcutaneously at bedtime. 6. Aspirin 81 mg p.o. daily. 7. Omeprazole 20 mg p.o. b.i.d. 8. Lovastatin 80 mg p.o. at bedtime. 9. Meloxicam 7.5 mg p.o. t.i.d.   Note: Atenolol and metformin have been discontinued.   PROCEDURES: 1. X-ray right  foot August 26, 2010.  This showed diffuse soft tissue     swelling without soft tissue gas or radiopaque foreign object,     surgical changes in the hindfoot, combination of presumed     osteomyelitis and diffuse bony sclerosis is nonspecific.     Possibilities include progressive sarcoid arthropathy or     infection/osteomyelitis. 2. Chest x-ray, August 26, 2010.  This was a mildly-to-moderately     degraded exam due to AP portable technique and patient's body     habitus.  However, there was cardiomegaly and low lung volumes.     Cannot exclude mild interstitial edema. 3. A 2-D echocardiogram, August 26, 2010.  This showed normal left     ventricular cavity size, wall thickness was increased in a pattern     of mild LVH.  Systolic function was normal.  Estimated ejection     fraction 60%-65%, left ventricular diastolic function parameters     were normal.  Right ventricular cavity size was mildly dilated,     wall thickness was normal.  Right atrium was mildly dilated and     pulmonary artery peak pressure 44 mmHg.  Pericardium showed  a small     free-flowing pericardial effusion posterior to the heart.  CONSULTATIONS: 1. Dr. Aldean Baker, Orthopedic surgeon. 2. Dr. Cliffton Asters, Infectious Disease specialist.  ADMISSION HISTORY:  As in H and P notes of August 26, 2010, dictated by Dr. Celso Watson.  However, in brief, this is a 50 year old female, with known history of insulin-requiring type 2 diabetes mellitus, hypertension, right ankle Charcot joint, who is status post surgery by podiatrist in 03/2010 with thin wire fixation and subsequently, status post hardware removal in 05/2010.  About 1 month ago, the patient developed drainage from the medial aspect of the affected joint, for which she was treated with a 1-week course of antibiotics with complete resolution.  A week later, she developed another drainage, this time, from the lateral aspect of the same ankle and was placed on  another week course of antibiotics.  This was completed a week prior to presentation. However, the drainage became more profuse and now appeared purulent.  In addition, the patient has experienced progressive weakness.  She presented to [facility], where she was evaluated by the Medical Hospitalist team and initial findings showed a WBC of 13.1, hemoglobin 6, platelets 617.  There were toxic granulations and bands on smear.  The patient had sinus tachycardia, borderline blood pressure 101/61 and a temperature of 101.2, BUN was 30, creatinine 1.2.  It was felt that the patient had infection, cellulitis/osteomyelitis of the infected foot and early sepsis syndrome.  She was therefore transferred to [facility] the same day, for further management.  CLINICAL COURSE: 1. Right foot infection, cellulitis, abscess/osteomyelitis of foot.     The patient was commenced on broad-spectrum antibiotic coverage     with a combination of vancomycin and Zosyn.  Orthopedic     consultation was kindly provided by Dr. Aldean Baker of [facility], and the patient underwent an I and D with placement of     antibiotic beads on August 28, 2010, in an uncomplicated procedure.     Subsequent abscess culture grew methicillin-sensitive     Staphylococcus aureus, as well as group B Streptococcus agalactiae.     Infectious Diseases consultation was kindly provided by Dr. Cliffton Asters.  She was recommended monotherapy with Ancef and a planned     total course of 42 days of antibiotic therapy.  The patient has     responded to above-mentioned management measures.  Subsequently, by     August 30, 2010, she felt considerably better.  She has had no     further episodes of pyrexia during the course of this     hospitalization.  2. Microcytic anemia.  The patient presented as described above, with a     hemoglobin of 6.0, MCV was 73.7, platelets were 617.  Smear showed     bandemia of 20%,  atypical lymphocytes, toxic granulation, mild left     shift.  DIC screen was negative.  Iron studies showed an iron level     of less than 10, percentage saturation was 156, vitamin B12 was     461, serum folate was 5.1, RBC folate was 700, and ferritin was     1079.  During the course of this hospitalization, the patient was     transfused a total of 3 units of PRBC, with a satisfactory bump in     hemoglobin to 9.0 on August 30, 2010.  3. Type 2 diabetes mellitus.  This is insulin requiring.  The patient     at presentation, was found to have a markedly elevated random blood     glucose of 630.  She was managed initially with intravenous insulin     infusion via glucostabilizer protocol, resulting in improvement     in glycemia to 163.  She was then transitioned to sliding scale     insulin coverage, carbohydrate-modified diet, as well as scheduled     Lantus insulin, with reasonable control.  Obviously, further     titration will be needed.  The patient has opined that she does     not want to be recommenced on oral hypoglycemic medications,     therefore she is to be managed with insulin monotherapy alone.     Of note, her hemoglobin A1c was 12.6.  4. Hypertension.  The patient's BP was borderline at the time of     initial presentation and as a matter of fact on arrival at [facility], she was actually mildly hypotensive with a BP of     96/60.  Her preadmission antihypertensive medications were placed     on hold.  She was managed on aggressive intravenous fluid hydration,     with satisfactory hemodynamic response.  Her BP has since     normalized, but she has remained normotensive, therefore we have     gingerly reintroduced lisinopril in a dose of 10 mg p.o. daily.     All other antihypertensive medications are still on hold, until     reevaluated by her primary MD.  5. Transient mild fluid overload.  The patient was found to be     somewhat "puffy" on August 29, 2010 with mild upper extremity and     lower extremity edema. Intravenous fluids were discontinued, and she     was managed with a single intravenous dose of Lasix, with resolution     of mild fluid overload.  DISPOSITION:  The patient on August 30, 2010, was considered clinically stable for discharge.  She did on August 29, 2010 have mild edema of her right upper extremity and because she had a PICC line in place in the upper extremity, this raised suspicion for possible DVT.  Venous Dopplers wer ordered, and showed no evidence of DVt, although  she did have a short segment of superficial thrombosis in her right antecubital vein.  ACTIVITY:  As tolerated.  Recommended no weightbearing on the right lower extremity.  DIET:  Heart-healthy/carbohydrate modified.  WOUND CARE:  Dressings as needed.  The patient is to utilize orthopedic boots 24 hours per day.  FOLLOW-UP INSTRUCTIONS:  The patient is to follow up with Dr. Lajoyce Corners, Orthopedic surgeon in 2 weeks postdischarge, telephone number [phone].  The patient is instructed to call for an appointment.  In addition, the patient will follow up with Dr. Cliffton Asters at the Infect[facility] Diseases [facility] on September 26, 2010 at 01:45 p.m.  An appointment has already been scheduled and the patient supplied with appropriate details.  SPECIAL INSTRUCTIONS:  Home health RN for wound care, antibiotic management, and diabetic care.  Home health PT and rolling walker have also been arranged. All this has been communicated to the patient and family.  They verbalized understanding.     Isidor Holts, M.D.     CO/MEDQ  D:  08/30/2010  T:  08/30/2010  Job:  [id number]  cc:   Nadara Mustard, MD Cliffton Asters,  M.D. Raven Mannan, MD Dr. Delano Watson Raven Watson, DPM  Electronically Signed by Isidor Holts M.D. on 09/01/2010 81:19:14 PM

## 2010-09-05 NOTE — Op Note (Signed)
  NAMEBAMA, HANSELMAN                ACCOUNT NO.:  [id number]  MEDICAL RECORD NO.:  [id number]  LOCATION:  3315                         FACILITY:  [facility]  PHYSICIAN:  Nadara Mustard, MD     DATE OF BIRTH:  April 25, 1960  DATE OF PROCEDURE:  08/28/2010 DATE OF DISCHARGE:                              OPERATIVE REPORT   PREOPERATIVE DIAGNOSES:  Abscess, osteomyelitis, right foot status post irrigation and debridement.  POSTOPERATIVE DIAGNOSES:  Abscess, osteomyelitis, right foot status post irrigation and debridement.  PROCEDURES:  Irrigation and debridement of skin, soft tissue, bone of the ankle and hindfoot with excision of bone and soft tissue and placement of antibiotic beads with vancomycin and gentamicin, 10 mL of stimulant beads.  SURGEON:  Nadara Mustard, MD  ANESTHESIA:  Local.  ESTIMATED BLOOD LOSS:  Minimal.  ANTIBIOTICS:  Vancomycin and Zosyn preoperatively.  DRAINS:  None.  COMPLICATIONS:  None.  TOURNIQUET TIME:  None.  DISPOSITION:  To PACU in stable condition.  INDICATION FOR PROCEDURE:  The patient is a 50 year old woman with diabetic insensate neuropathy status post a large infection of the hindfoot status post thin wire fixation for a Charcot foot without ulceration back in February.  She is status post irrigation and debridement with Dr. Magnus Ivan yesterday and returns today for repeat irrigation and debridement and placement of antibiotic beads.  Risks and benefits were discussed including persistent infection, neurovascular injury, nonhealing of the wound, need for potential amputation.  The patient states she understands and wished to proceed at this time.  DESCRIPTION OF PROCEDURE:  The patient was brought to the OR room #1 and underwent local anesthetic.  After adequate level of anesthesia was obtained, the patient's right lower extremity was prepped using the DuraPrep and draped into a sterile field.  The sutures were removed. The wound was  irrigated with pulsatile lavage.  Bone was curetted, debrided.  Soft tissue was also debrided back to bleeding, viable granulation tissue.  The patient had good granulation tissue and a good bleeding bed.  The stimulant 10 mL antibiotic beads were made with 500 mg of vancomycin and 240 mg of gentamicin.  The beads were placed within the void.  The incision was closed using 2-0 nylon with far-near-near- far suture technique.  The wound was covered with Adaptic, orthopedic sponges, ABD dressing, Kerlix, and Coban.  The patient was then taken to PACU in stable condition.  Plan for a fracture boot, nonweightbearing, on the right.  Anticipate 4-6 weeks of IV antibiotics pending the results of the cultures.  At that time, anticipate that the patient could undergo tibial-calcaneal fusion with an IM nail.     Nadara Mustard, MD     MVD/MEDQ  D:  08/28/2010  T:  08/29/2010  Job:  [id number]  Electronically Signed by Aldean Baker MD on 09/05/2010 09:40:32 AM

## 2010-09-06 NOTE — H&P (Addendum)
Raven Watson, Raven Watson                ACCOUNT NO.:  [id number]  MEDICAL RECORD NO.:  [id number]  LOCATION:  3315                         FACILITY:  [facility]  PHYSICIAN:  Celso Amy, MD   DATE OF BIRTH:  11/22/1960  DATE OF ADMISSION:  08/26/2010 DATE OF DISCHARGE:  LH                             HISTORY & PHYSICAL   PRIMARY CARE PHYSICIAN:  Dr. Angela Adam at [facility].  ENDOCRINOLOGIST:  Dr. [name] at [facility]  PEDIATRIST:  Dr. [name] at [facility].  CHIEF COMPLAINT:  Weakness.  HISTORY OF PRESENT ILLNESS:  The patient is a 50 year old African American female with a past medical history of diabetes type 2, hypertension who presented to the ER with a chief complaint of weakness. History of present illness dates back to this Friday when the patient started feeling extremely weak.  The patient was not able to move last night and that is the reason she presented to the ER.  The patient has had foot operated for Charcot joint in February.  The patient later had a brace on the foot for 46 weeks.  The patient developed drainage from the foot and was on 2 courses of antibiotics.  The patient finished her last course of antibiotics 1 week ago.  The patient later complained of having foul smelling discharge since Friday.  The discharge increases when the patient ambulates.  The patient also complained of nausea and vomiting since Friday.  No complaint of hematemesis.  No complaint of bright red blood per rectum.  No complaint of melena.  The patient complained of dizziness on postural change.  The patient developed fever in the ER.  She was afebrile at home.  The patient complained of chills and cough at home.  No complaint of syncope.  No complaint of any focal weakness.  No complaint of any speech difficulties or vision changes. No complaint of recent travel.  REVIEW OF SYSTEMS:  Negative besides the HPI.  ALLERGIES:  The patient has no known drug allergies.  SOCIAL  HISTORY:  The patient is nonsmoker, nondrinker and lives with a roommate.  FAMILY HISTORY:  The patient's mother is alive.  She lives in a nursing home and had a history of diabetes mellitus.  The patient is not sure about the father's medical history.  PAST MEDICAL HISTORY:  Positive for: 1. Hypertension. 2. Diabetes mellitus type 2. 3. Status post for Charcot foot surgery. 4. Hypercholesterolemia.  MEDICATIONS: 1. Atenolol 50 mg p.o. daily. 2. Metformin 1000 mg p.o. b.i.d. 3. Lovastatin 80 mg p.o. daily. 4. Enalapril 40 mg p.o. daily. 5. Gemfibrozil 600 mg p.o. b.i.d. 6. Aspirin 81 mg p.o. daily.  PHYSICAL EXAMINATION:  VITALS:  Blood pressure 101/61, pulse 108-120, respiratory rate 16, temperature 101.2. GENERAL:  The patient is awake, alert, oriented to time, place and person, is well built, well-built, well-nourished. HEENT:  Conjunctivae is pale.  Sclerae anicteric.  Pupils equally reactive to light and accommodation.  Extraocular movement is intact. Oral membranes are dry.  RESPIRATORY:  No acute respiratory distress. CHEST:  Clear to auscultation bilaterally. CARDIOVASCULAR:  S1, S2 is regular in rate and rhythm.  The patient was tachycardic. GI:  Bowel sounds  are present.  Abdomen is soft, nontender, nondistended. EXTREMITIES:  Right lower extremity has nonpitting edema.  Right foot has drainage from the lateral side.  The drainage is foul-smelling. NEUROLOGIC:  Cranial nerves II-XII are grossly intact. PSYCH:  The patient has normal mood and affect.  LABORATORY DATA:  Sodium 128, potassium 4.9, serum chloride 88, bicarb 25.  The patient's BUN is 30, creatinine is 1.2.  The patient's glucose is 630.  The patient's hemoglobin is 6, WBC 13.1, platelets 617,000. The patient has toxic granulation and also bands.  The patient has more than 20% bands in her CBC.  The patient's EKG showed normal sinus rhythm but the patient is tachycardic.  The patient's foot x-ray done  showed diffuse soft tissue swelling without soft tissue, gas, or radiopaque foreign objects.  It also showed progressive Charcot arthropathy or infection/osteomyelitis.  The patient had calcium of 11.  The patient's UA showed glucose of more than 1000.  IMPRESSION: 1. ID.  The patient is coming with fever and drainage of foot.  The     patient's differential include osteomyelitis/sepsis/cellulitis. 2. Anemia.  The patient is coming with hemoglobin of 6.  The patient's     anemia could be from:     a.     Hemolysis     b.     Anemia of chronic disease.     c.     Bleeding.     d.     Iron-deficiency.     e.     DIC. 3. Endo:  The patient is going with uncontrolled diabetes mellitus     type 2 with glucose of more than 600, but the patient's bicarb is     normal, so the patient is in nonketotic hyperglycemia. 4. Deep venous thrombosis.  The patient's one foot has edema more than     the other.  We will rule out deep venous thrombosis. 5. CVS:  The patient is hemodynamically fragile.  She is not     orthostatic but blood pressure is on the lower side and is     tachycardic. 6. Fluid, electrolyte, nutrition.  The patient is hyponatremic.  This     hyponatremia is most likely from hypovolemia and hyperglycemia. 7. The patient is hypercalcemic.  Hypercalcemia is most likely from     hypovolemia.  PLAN: 1. We will admit to Step-down Unit at [facility]. 2. We will start IV vanco and IV Zosyn for antibiotic coverage of     sepsis/osteomyelitis. 3. We will do cardiac enzymes to rule out MI. 4. We will start the patient on glucose stabilizer for her nonketotic     hyperglycemia. 5. We will check anemia profile. 6. We will check hemolytic profile. 7. We will ask for ortho consult/podiatry consults at [facility]. 8. The patient is being transfused 2 units of blood now.  We will type     and screen 2 more units as the patient might need more blood.     Celso Amy,  MD     MB/MEDQ  D:  08/26/2010  T:  08/27/2010  Job:  [id number]  Electronically Signed by Celso Amy M.D. on 09/06/2010 01:17:03 PM

## 2010-09-06 NOTE — H&P (Signed)
  NAMEAVALIN, BRILEY                ACCOUNT NO.:  [id number]  MEDICAL RECORD NO.:  [id number]  LOCATION:  [facility]                          FACILITY:  [facility]  PHYSICIAN:  Celso Amy, MD   DATE OF BIRTH:  June 03, 1960  DATE OF ADMISSION:  08/26/2010 DATE OF DISCHARGE:  LH                             HISTORY & PHYSICAL   PRIMARY CARE PHYSICIAN:  Dr. Angela Adam  at [facility].  PODIATRIST:  [name], DPM  ENDOCRINOLOGIST:  __________ at [facility]  Dictation ended at this point.     Celso Amy, MD     MB/MEDQ  D:  08/26/2010  T:  08/26/2010  Job:  [id number]  Electronically Signed by Celso Amy M.D. on 09/06/2010 01:16:04 PM

## 2010-09-18 DIAGNOSIS — A4901 Methicillin susceptible Staphylococcus aureus infection, unspecified site: Secondary | ICD-10-CM | POA: Insufficient documentation

## 2010-09-18 DIAGNOSIS — E11610 Type 2 diabetes mellitus with diabetic neuropathic arthropathy: Secondary | ICD-10-CM | POA: Insufficient documentation

## 2010-09-18 DIAGNOSIS — I10 Essential (primary) hypertension: Secondary | ICD-10-CM | POA: Insufficient documentation

## 2010-09-26 ENCOUNTER — Encounter: Payer: Self-pay | Admitting: Internal Medicine

## 2010-09-26 ENCOUNTER — Ambulatory Visit (INDEPENDENT_AMBULATORY_CARE_PROVIDER_SITE_OTHER): Payer: BC Managed Care – PPO | Admitting: Internal Medicine

## 2010-09-26 VITALS — Temp 97.5°F | Ht 68.0 in

## 2010-09-26 DIAGNOSIS — A4901 Methicillin susceptible Staphylococcus aureus infection, unspecified site: Secondary | ICD-10-CM

## 2010-09-26 NOTE — Progress Notes (Signed)
  Subjective:    Patient ID: Coralee Edberg, female    DOB: Aug 21, 1960, 50 y.o.   MRN: [id number]  HPI  Mrs. Lainez is in for her hospital followup visit today. She is a 50 year old diabetic with Charcot arthropathy who underwent right ankle surgery with hardware fixation in February. 2 months ago she began to have bleeding and drainage from her right foot and 2 wires migrated out on their own. Her podiatrist treated her with 2 short courses of an oral antibiotic but last week the drainage continued and began to have a foul odor so she was admitted on July 1. She underwent incision and drainage and had one remaining pin removed. She had a large abscess cavity with bony involvement. She underwent a second I&D on July 3 and had vancomycin and gentamicin impregnated beads placed. Abscess cultures have grown methicillin sensitive staph aureus and group B strep. Rare gram-negative rods were seen on Gram stain. She was discharged on IV Ancef and is now in her 48nd day of therapy. She has not had any problems with her Ancef for the PICC. She feels like her foot is doing better with decreased drainage, pain and swelling. She had noticed some small beads coming out of the wound but that has decreased dramatically over the last few weeks.    Review of Systems     Objective:   Physical Exam  Constitutional: No distress.  Musculoskeletal:       Her right arm PICC site looks good.  There is an incision over her right lateral ankle. Her sutures are in place. She has no cellulitis of her foot. She has some edema to mid shin. There is some brown drainage on the gauze dressings.          Assessment & Plan:

## 2010-09-26 NOTE — Assessment & Plan Note (Signed)
She seems to be doing better but obviously has a very complex deep foot infection. Her sedimentation rate been hospitalized was greater than 140. I will repeat it today and continue her IV Ancef. I will see her back next week and consider converting her to oral antibiotics at that time.

## 2010-09-27 LAB — C-REACTIVE PROTEIN: CRP: 0.3 mg/dL (ref <0.6)

## 2010-10-09 ENCOUNTER — Encounter: Payer: Self-pay | Admitting: Internal Medicine

## 2010-10-09 ENCOUNTER — Ambulatory Visit (INDEPENDENT_AMBULATORY_CARE_PROVIDER_SITE_OTHER): Payer: BC Managed Care – PPO | Admitting: Internal Medicine

## 2010-10-09 VITALS — BP 142/99 | HR 102 | Temp 97.7°F

## 2010-10-09 DIAGNOSIS — A4901 Methicillin susceptible Staphylococcus aureus infection, unspecified site: Secondary | ICD-10-CM

## 2010-10-09 MED ORDER — CEPHALEXIN 500 MG PO CAPS: 500.0000 mg | ORAL_CAPSULE | Freq: Four times a day (QID) | ORAL | Status: AC

## 2010-10-09 MED ORDER — CEPHALEXIN 500 MG PO CAPS: 500.0000 mg | ORAL_CAPSULE | Freq: Four times a day (QID) | ORAL | Status: DC

## 2010-10-09 NOTE — Progress Notes (Signed)
Order to remove PICC line obtained from Dr. Orvan Falconer. Pt. identified with name and date of birth. PICC dressing removed, site unremarkable. PICC line removed using sterile procedure @ 1230 pm. PICC length equal to that noted in pt's hospital chart of 40 cm. Sterile petroleum gauze + sterile 4X4 applied to PICC site, pressure applied for 10 minutes and covered with Medipore tape as a pressure dressing. Pt. instructed to limit use of arm for 1 hour. Pt. instructed that the pressure dressing should remain in place for 24 hours. Pt. verablized understanding of these instructions. Jennet Maduro, RN

## 2010-10-09 NOTE — Progress Notes (Signed)
  Subjective:    Patient ID: Raven Watson, female    DOB: [birth year], 50 y.o.   MRN: [id number]  HPI Raven Watson is in for her routine visit. She is now completed 44 days of IV Ancef for her severe right foot infection with methicillin sensitive staph aureus and group B strep. She has not had any trouble tolerating her PICC or the Ancef. She is still having some drainage from the right lateral foot the incision but states he incision is getting smaller and drainage is decreasing. At the time of her visit last week her sed rate had decreased from 140-51 and her C-reactive protein was now normal at 0.3.    Review of Systems     Objective:   Physical Exam  Constitutional: No distress.  Musculoskeletal:       Her right arm PICC site looks good.  There is an incision over her right lateral ankle. Her sutures have been removed. She has no cellulitis of her foot. She has some edema to mid shin. There is some green drainage on the gauze dressings. The wound is about 2-1/2 inches in length and about 1 inch wide. There is some yellow slough at the base of the wound. There is no exposed bone.          Assessment & Plan:

## 2010-10-09 NOTE — Assessment & Plan Note (Signed)
Her right foot osteomyelitis appears to be a little bit better but she still has a long way to go. I will stop her Ancef, remove the PICC, and continue her antibiotic therapy with oral Keflex.

## 2010-10-24 ENCOUNTER — Encounter (HOSPITAL_COMMUNITY)
Admission: RE | Admit: 2010-10-24 | Discharge: 2010-10-24 | Disposition: A | Discharge status: Home / Self Care | Payer: BC Managed Care – PPO | Source: Ambulatory Visit | Attending: Orthopedic Surgery | Admitting: Orthopedic Surgery

## 2010-10-24 ENCOUNTER — Other Ambulatory Visit (HOSPITAL_COMMUNITY): Payer: Self-pay | Admitting: Orthopedic Surgery

## 2010-10-24 ENCOUNTER — Ambulatory Visit (HOSPITAL_COMMUNITY)
Admission: RE | Admit: 2010-10-24 | Discharge: 2010-10-24 | Disposition: A | Discharge status: Home / Self Care | Payer: BC Managed Care – PPO | Source: Ambulatory Visit | Attending: Orthopedic Surgery | Admitting: Orthopedic Surgery

## 2010-10-24 DIAGNOSIS — J90 Pleural effusion, not elsewhere classified: Secondary | ICD-10-CM | POA: Insufficient documentation

## 2010-10-24 DIAGNOSIS — Z01812 Encounter for preprocedural laboratory examination: Secondary | ICD-10-CM | POA: Insufficient documentation

## 2010-10-24 DIAGNOSIS — M869 Osteomyelitis, unspecified: Secondary | ICD-10-CM | POA: Insufficient documentation

## 2010-10-24 DIAGNOSIS — Z01818 Encounter for other preprocedural examination: Secondary | ICD-10-CM | POA: Insufficient documentation

## 2010-10-24 DIAGNOSIS — I517 Cardiomegaly: Secondary | ICD-10-CM | POA: Insufficient documentation

## 2010-10-24 LAB — COMPREHENSIVE METABOLIC PANEL
ALT: 5 U/L (ref 0–35)
AST: 13 U/L (ref 0–37)
Albumin: 2.4 g/dL — ABNORMAL LOW (ref 3.5–5.2)
CO2: 28 mEq/L (ref 19–32)
Calcium: 10.2 mg/dL (ref 8.4–10.5)
Creatinine, Ser: 0.77 mg/dL (ref 0.50–1.10)
GFR calc non Af Amer: >60 mL/min (ref >60)
Sodium: 140 mEq/L (ref 135–145)
Total Protein: 6.3 g/dL (ref 6.0–8.3)

## 2010-10-24 LAB — APTT: aPTT: 41 seconds — ABNORMAL HIGH (ref 24–37)

## 2010-10-24 LAB — PROTIME-INR: INR: 1.02 (ref 0.00–1.49)

## 2010-10-24 LAB — CBC
MCV: 82.6 fL (ref 78.0–100.0)
Platelets: 379 10*3/uL (ref 150–400)
RBC: 4.88 MIL/uL (ref 3.87–5.11)
RDW: 17 % — ABNORMAL HIGH (ref 11.5–15.5)
WBC: 8.3 10*3/uL (ref 4.0–10.5)

## 2010-10-24 LAB — SURGICAL PCR SCREEN: Staphylococcus aureus: NEGATIVE

## 2010-10-30 ENCOUNTER — Ambulatory Visit (HOSPITAL_COMMUNITY)
Admission: RE | Admit: 2010-10-30 | Discharge: 2010-10-30 | Disposition: A | Discharge status: Home / Self Care | Payer: BC Managed Care – PPO | Source: Ambulatory Visit | Attending: Orthopedic Surgery | Admitting: Orthopedic Surgery

## 2010-10-30 DIAGNOSIS — M9080 Osteopathy in diseases classified elsewhere, unspecified site: Secondary | ICD-10-CM | POA: Insufficient documentation

## 2010-10-30 DIAGNOSIS — E1149 Type 2 diabetes mellitus with other diabetic neurological complication: Secondary | ICD-10-CM | POA: Insufficient documentation

## 2010-10-30 DIAGNOSIS — Z01810 Encounter for preprocedural cardiovascular examination: Secondary | ICD-10-CM | POA: Insufficient documentation

## 2010-10-30 DIAGNOSIS — E1169 Type 2 diabetes mellitus with other specified complication: Secondary | ICD-10-CM | POA: Insufficient documentation

## 2010-10-30 DIAGNOSIS — Z01812 Encounter for preprocedural laboratory examination: Secondary | ICD-10-CM | POA: Insufficient documentation

## 2010-10-30 DIAGNOSIS — E1142 Type 2 diabetes mellitus with diabetic polyneuropathy: Secondary | ICD-10-CM | POA: Insufficient documentation

## 2010-10-30 DIAGNOSIS — Z01818 Encounter for other preprocedural examination: Secondary | ICD-10-CM | POA: Insufficient documentation

## 2010-10-30 DIAGNOSIS — I10 Essential (primary) hypertension: Secondary | ICD-10-CM | POA: Insufficient documentation

## 2010-10-30 DIAGNOSIS — M869 Osteomyelitis, unspecified: Secondary | ICD-10-CM | POA: Insufficient documentation

## 2010-10-30 LAB — GLUCOSE, CAPILLARY: Glucose-Capillary: 74 mg/dL (ref 70–99)

## 2010-10-31 LAB — GLUCOSE, CAPILLARY: Glucose-Capillary: 89 mg/dL (ref 70–99)

## 2010-11-13 ENCOUNTER — Ambulatory Visit (INDEPENDENT_AMBULATORY_CARE_PROVIDER_SITE_OTHER): Payer: BC Managed Care – PPO | Admitting: Internal Medicine

## 2010-11-13 VITALS — BP 160/107 | HR 102 | Temp 97.9°F

## 2010-11-13 DIAGNOSIS — Z0000 Encounter for general adult medical examination without abnormal findings: Secondary | ICD-10-CM

## 2010-11-13 DIAGNOSIS — A4901 Methicillin susceptible Staphylococcus aureus infection, unspecified site: Secondary | ICD-10-CM

## 2010-11-13 DIAGNOSIS — Z23 Encounter for immunization: Secondary | ICD-10-CM

## 2010-11-13 LAB — SEDIMENTATION RATE: Sed Rate: 120 mm/hr — ABNORMAL HIGH (ref 0–22)

## 2010-11-13 LAB — C-REACTIVE PROTEIN: CRP: 0.42 mg/dL (ref <0.60)

## 2010-11-13 NOTE — Assessment & Plan Note (Addendum)
I will need to discuss the situation with Dr. Lajoyce Corners to determine the status of her infection. I will keep her off of antibiotic therapy for now and repeat her sedimentation rate and C-reactive protein. At the time of her last visit in mid August her sed rate had decreased from 140-51 and her C. reactive protein had normalized at 0.3.  Addendum: Her sed rate is elevated at 120 but her CRP remains normal at 0.48. I spoke with Dr. Lajoyce Corners who felt her foot looked good at the time of her most recent surgery. We agreed to continue observation off antibiotics. I called Ms. Molden and gave her this recommendation.

## 2010-11-13 NOTE — Progress Notes (Signed)
  Subjective:    Patient ID: Raven Watson, female    DOB: 1960-05-13, 50 y.o.   MRN: [id number]  HPI Raven Watson is in for her routine followup visit. She completed her Keflex last weekend which gave her a total of about 11 weeks of antibiotic therapy for her methicillin sensitive staph aureus right foot infection. She states that the drainage from her right foot had decreased but she underwent another surgery and had new antibiotic impregnated beads placed by Dr. Lajoyce Corners 2 weeks ago. She's not had any recent fevers or chills. I checked with our lab and did not find any new culture data.    Review of Systems     Objective:   Physical Exam  Constitutional: No distress.  Musculoskeletal:       There is an incision over her right lateral foot with bloody drainage on the dressings. There is no surrounding cellulitis.  Psychiatric: She has a normal mood and affect.          Assessment & Plan:

## 2010-11-14 ENCOUNTER — Encounter: Payer: Self-pay | Admitting: Internal Medicine

## 2010-11-14 NOTE — Progress Notes (Signed)
Addended by: Jennet Maduro D on: 11/14/2010 09:31 AM   Modules accepted: Orders

## 2010-11-15 NOTE — Op Note (Signed)
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  PROCEDURE: 1. Irrigation and debridement of skin, soft tissue and bone with     excision of bone from the hind foot including the calcaneus and     talus as well as a debridement of soft tissue from around the     wound. 2. Placement of antibiotic beads with vancomycin 1 g and gentamicin     240 mg. 3. Placement of ACell xenograft tissue graft.  SURGEON:  Nadara Mustard, MD  ANESTHESIA:  General.  ESTIMATED BLOOD LOSS:  Minimal.  ANTIBIOTICS:  Kefzol 2 g.  DRAINS:  None.  COMPLICATIONS:  None.  TOURNIQUET TIME:  None.  DISPOSITION:  PACU in stable condition.  INDICATIONS FOR PROCEDURE:  The patient is a 50 year old woman with diabetic insensate neuropathy Charcot collapse of right foot status post loosening off external fixation for which she developed a deep infection.  The patient has undergone serial wound debridements and presents at this time for repeat debridement and placement of antibiotic beads.  Risks and benefits of surgery were discussed including persistent infection, neurovascular injury, nonhealing of the wound, need for additional surgery.  The patient states she understands and wished to proceed at this time.  DESCRIPTION OF PROCEDURE:  The patient was brought to OR room 7 and underwent a general anesthetic.  After adequate level of anesthesia obtained, the patient's right lower extremity was  prepped using DuraPrep and draped into a sterile field.  Approximately, 3-mm around the wound edges was ellipsed out from the wound.  The wound is 5 cm x 3 cm and approximately 3 cm deep, this was debrided with curette of both the calcaneus and talus with further debridement through the midfoot. Fibrinous exudate of tissue was debrided.  There was no abscess, no purulence, no signs of any purulent infection.  After debridement of the skin, soft tissue and bone, pulsatile lavage was used, 3 liters of pulsatile lavage.  After thorough debridement, the bony void was then packed with the stimulant beads with 1 g of vancomycin and 240 mg of gentamicin.  The ACell tissue graft was then fenestrated on the back table.  This was all layered to be four layers thick and this was sutured in place for the wound and the wound was also sutured closed with a 2-0 nylon suture.  The wound was covered with Mepitel, Surg-I- Loop, 4x4s, ABD, Kerlix, and a Coban dressing.  The patient was extubated and taken to the PACU in stable condition.  The patient wishes for discharge to home.  She request no pain medication states that she only needs Tylenol for previous surgeries.  We will plan to  followup in the office in 1 week, minimize weightbearing on the right lower extremity.     Nadara Mustard, MD     MVD/MEDQ  D:  10/30/2010  T:  10/30/2010  Job:  [id number]  Electronically Signed by Aldean Baker MD on 11/15/2010 95:62:13 AM

## 2010-12-25 ENCOUNTER — Ambulatory Visit: Payer: BC Managed Care – PPO | Admitting: Internal Medicine

## 2011-02-04 ENCOUNTER — Encounter (HOSPITAL_COMMUNITY): Payer: Self-pay

## 2011-02-04 ENCOUNTER — Other Ambulatory Visit (HOSPITAL_COMMUNITY): Payer: Self-pay | Admitting: Orthopedic Surgery

## 2011-02-04 MED ORDER — CEFAZOLIN SODIUM 1-5 GM-% IV SOLN
1.0000 g | INTRAVENOUS | Status: DC
  Filled 2011-02-04: qty 50

## 2011-02-04 NOTE — Progress Notes (Signed)
Called Dr. Audrie Lia office, spoke with Elnita Maxwell, requested orders for surgery.

## 2011-02-05 ENCOUNTER — Encounter (HOSPITAL_COMMUNITY): Payer: Self-pay | Admitting: Surgery

## 2011-02-05 ENCOUNTER — Ambulatory Visit (HOSPITAL_COMMUNITY): Payer: Self-pay | Admitting: Vascular Surgery

## 2011-02-05 ENCOUNTER — Other Ambulatory Visit: Payer: Self-pay | Admitting: Orthopedic Surgery

## 2011-02-05 ENCOUNTER — Encounter (HOSPITAL_COMMUNITY)
Admission: RE | Disposition: A | Discharge status: Home / Self Care | Payer: Self-pay | Source: Ambulatory Visit | Attending: Orthopedic Surgery

## 2011-02-05 ENCOUNTER — Other Ambulatory Visit: Payer: Self-pay

## 2011-02-05 ENCOUNTER — Ambulatory Visit (HOSPITAL_COMMUNITY): Payer: Self-pay

## 2011-02-05 ENCOUNTER — Encounter (HOSPITAL_COMMUNITY): Payer: Self-pay | Admitting: Vascular Surgery

## 2011-02-05 ENCOUNTER — Inpatient Hospital Stay (HOSPITAL_COMMUNITY)
Admission: RE | Admit: 2011-02-05 | Discharge: 2011-02-08 | DRG: 617 | Disposition: A | Discharge status: Home / Self Care | Payer: Self-pay | Source: Ambulatory Visit | Attending: Orthopedic Surgery | Admitting: Orthopedic Surgery

## 2011-02-05 DIAGNOSIS — M9080 Osteopathy in diseases classified elsewhere, unspecified site: Secondary | ICD-10-CM | POA: Diagnosis present

## 2011-02-05 DIAGNOSIS — L97509 Non-pressure chronic ulcer of other part of unspecified foot with unspecified severity: Secondary | ICD-10-CM | POA: Diagnosis present

## 2011-02-05 DIAGNOSIS — Z794 Long term (current) use of insulin: Secondary | ICD-10-CM

## 2011-02-05 DIAGNOSIS — I10 Essential (primary) hypertension: Secondary | ICD-10-CM | POA: Diagnosis present

## 2011-02-05 DIAGNOSIS — I96 Gangrene, not elsewhere classified: Secondary | ICD-10-CM | POA: Diagnosis present

## 2011-02-05 DIAGNOSIS — E1149 Type 2 diabetes mellitus with other diabetic neurological complication: Secondary | ICD-10-CM | POA: Diagnosis present

## 2011-02-05 DIAGNOSIS — E1169 Type 2 diabetes mellitus with other specified complication: Principal | ICD-10-CM | POA: Diagnosis present

## 2011-02-05 DIAGNOSIS — M869 Osteomyelitis, unspecified: Secondary | ICD-10-CM | POA: Diagnosis present

## 2011-02-05 HISTORY — DX: Pure hypercholesterolemia, unspecified: E78.00

## 2011-02-05 HISTORY — DX: Headache: R51

## 2011-02-05 HISTORY — PX: AMPUTATION: SHX166

## 2011-02-05 HISTORY — DX: Encounter for other specified aftercare: Z51.89

## 2011-02-05 HISTORY — PX: LEG AMPUTATION BELOW KNEE: SHX694

## 2011-02-05 LAB — CBC
Hemoglobin: 11.8 g/dL — ABNORMAL LOW (ref 12.0–15.0)
MCH: 26.2 pg (ref 26.0–34.0)
MCHC: 33.4 g/dL (ref 30.0–36.0)
Platelets: 509 10*3/uL — ABNORMAL HIGH (ref 150–400)
RDW: 16.2 % — ABNORMAL HIGH (ref 11.5–15.5)

## 2011-02-05 LAB — COMPREHENSIVE METABOLIC PANEL
ALT: 7 U/L (ref 0–35)
Albumin: 2.4 g/dL — ABNORMAL LOW (ref 3.5–5.2)
Calcium: 9.9 mg/dL (ref 8.4–10.5)
GFR calc Af Amer: 78 mL/min — ABNORMAL LOW (ref >90)
Glucose, Bld: 135 mg/dL — ABNORMAL HIGH (ref 70–99)
Sodium: 138 mEq/L (ref 135–145)
Total Protein: 6.9 g/dL (ref 6.0–8.3)

## 2011-02-05 LAB — GLUCOSE, CAPILLARY: Glucose-Capillary: 81 mg/dL (ref 70–99)

## 2011-02-05 LAB — SURGICAL PCR SCREEN
MRSA, PCR: NEGATIVE
Staphylococcus aureus: NEGATIVE

## 2011-02-05 SURGERY — AMPUTATION BELOW KNEE
Anesthesia: General | Site: Leg Lower | Laterality: Right | Wound class: Clean

## 2011-02-05 MED ORDER — METHOCARBAMOL 100 MG/ML IJ SOLN: 500.0000 mg | Freq: Four times a day (QID) | INTRAVENOUS | Status: DC | PRN

## 2011-02-05 MED ORDER — PATIENT'S GUIDE TO USING COUMADIN BOOK
Freq: Once | Status: AC
  Administered 2011-02-05: 23:00:00
  Filled 2011-02-05: qty 1

## 2011-02-05 MED ORDER — ONDANSETRON HCL 4 MG PO TABS: 4.0000 mg | ORAL_TABLET | Freq: Four times a day (QID) | ORAL | Status: DC | PRN

## 2011-02-05 MED ORDER — INSULIN ASPART 100 UNIT/ML ~~LOC~~ SOLN
0.0000 [IU] | Freq: Three times a day (TID) | SUBCUTANEOUS | Status: DC
  Administered 2011-02-06: 3 [IU] via SUBCUTANEOUS
  Filled 2011-02-05: qty 3

## 2011-02-05 MED ORDER — MIDAZOLAM HCL 5 MG/5ML IJ SOLN
INTRAMUSCULAR | Status: DC | PRN
  Administered 2011-02-05: 2 mg via INTRAVENOUS

## 2011-02-05 MED ORDER — LACTATED RINGERS IV SOLN
INTRAVENOUS | Status: DC
  Administered 2011-02-05: 16:00:00 via INTRAVENOUS

## 2011-02-05 MED ORDER — HYDROMORPHONE HCL PF 1 MG/ML IJ SOLN
0.2500 mg | INTRAMUSCULAR | Status: DC | PRN
  Administered 2011-02-05 (×2): 0.5 mg via INTRAVENOUS

## 2011-02-05 MED ORDER — MUPIROCIN 2 % EX OINT: TOPICAL_OINTMENT | Freq: Once | CUTANEOUS | Status: DC

## 2011-02-05 MED ORDER — LACTATED RINGERS IV SOLN
INTRAVENOUS | Status: DC | PRN
  Administered 2011-02-05: 18:00:00 via INTRAVENOUS

## 2011-02-05 MED ORDER — OXYCODONE-ACETAMINOPHEN 5-325 MG PO TABS
1.0000 | ORAL_TABLET | ORAL | Status: DC | PRN
  Administered 2011-02-05 – 2011-02-07 (×6): 2 via ORAL
  Filled 2011-02-05 (×6): qty 2

## 2011-02-05 MED ORDER — FERROUS SULFATE 325 (65 FE) MG PO TABS
325.0000 mg | ORAL_TABLET | Freq: Three times a day (TID) | ORAL | Status: DC
  Administered 2011-02-06 – 2011-02-08 (×7): 325 mg via ORAL
  Filled 2011-02-05 (×10): qty 1

## 2011-02-05 MED ORDER — WARFARIN SODIUM 10 MG PO TABS
10.0000 mg | ORAL_TABLET | Freq: Once | ORAL | Status: AC
  Administered 2011-02-05: 10 mg via ORAL
  Filled 2011-02-05: qty 1

## 2011-02-05 MED ORDER — DOCUSATE SODIUM 100 MG PO CAPS
100.0000 mg | ORAL_CAPSULE | Freq: Two times a day (BID) | ORAL | Status: DC
  Administered 2011-02-06 – 2011-02-08 (×5): 100 mg via ORAL
  Filled 2011-02-05 (×5): qty 1

## 2011-02-05 MED ORDER — MUPIROCIN 2 % EX OINT
TOPICAL_OINTMENT | CUTANEOUS | Status: AC
  Filled 2011-02-05: qty 22

## 2011-02-05 MED ORDER — ONDANSETRON HCL 4 MG/2ML IJ SOLN: 4.0000 mg | Freq: Four times a day (QID) | INTRAMUSCULAR | Status: DC | PRN

## 2011-02-05 MED ORDER — INSULIN ASPART 100 UNIT/ML ~~LOC~~ SOLN
4.0000 [IU] | Freq: Three times a day (TID) | SUBCUTANEOUS | Status: DC
  Administered 2011-02-06 – 2011-02-07 (×5): 4 [IU] via SUBCUTANEOUS

## 2011-02-05 MED ORDER — HYDROMORPHONE HCL PF 1 MG/ML IJ SOLN
0.5000 mg | INTRAMUSCULAR | Status: DC | PRN
  Administered 2011-02-05: 1 mg via INTRAVENOUS
  Filled 2011-02-05: qty 1

## 2011-02-05 MED ORDER — INSULIN GLARGINE 100 UNIT/ML ~~LOC~~ SOLN
35.0000 [IU] | Freq: Every day | SUBCUTANEOUS | Status: DC
Start: 2011-02-05 — End: 2011-02-08
  Administered 2011-02-05 – 2011-02-07 (×3): 35 [IU] via SUBCUTANEOUS
  Filled 2011-02-05: qty 3

## 2011-02-05 MED ORDER — EPHEDRINE SULFATE 50 MG/ML IJ SOLN
INTRAMUSCULAR | Status: DC | PRN
  Administered 2011-02-05 (×2): 5 mg via INTRAVENOUS

## 2011-02-05 MED ORDER — CEFAZOLIN SODIUM-DEXTROSE 2-3 GM-% IV SOLR
2.0000 g | Freq: Four times a day (QID) | INTRAVENOUS | Status: AC
  Administered 2011-02-05 – 2011-02-06 (×3): 2 g via INTRAVENOUS
  Filled 2011-02-05 (×3): qty 50

## 2011-02-05 MED ORDER — SODIUM CHLORIDE 0.9 % IV SOLN
INTRAVENOUS | Status: DC
  Administered 2011-02-05: 20 mL/h via INTRAVENOUS

## 2011-02-05 MED ORDER — PROMETHAZINE HCL 25 MG/ML IJ SOLN
INTRAMUSCULAR | Status: AC
  Filled 2011-02-05: qty 1

## 2011-02-05 MED ORDER — WARFARIN VIDEO
Freq: Once | Status: AC
  Administered 2011-02-06: 10:00:00

## 2011-02-05 MED ORDER — GEMFIBROZIL 600 MG PO TABS
600.0000 mg | ORAL_TABLET | Freq: Two times a day (BID) | ORAL | Status: DC
  Administered 2011-02-06 – 2011-02-08 (×5): 600 mg via ORAL
  Filled 2011-02-05 (×7): qty 1

## 2011-02-05 MED ORDER — MEPERIDINE HCL 25 MG/ML IJ SOLN: 6.2500 mg | INTRAMUSCULAR | Status: DC | PRN

## 2011-02-05 MED ORDER — CEFAZOLIN SODIUM 1-5 GM-% IV SOLN
INTRAVENOUS | Status: DC | PRN
  Administered 2011-02-05: 2 g via INTRAVENOUS

## 2011-02-05 MED ORDER — HYDROCHLOROTHIAZIDE 12.5 MG PO CAPS
12.5000 mg | ORAL_CAPSULE | Freq: Every day | ORAL | Status: DC
  Administered 2011-02-05 – 2011-02-08 (×4): 12.5 mg via ORAL
  Filled 2011-02-05 (×4): qty 1

## 2011-02-05 MED ORDER — METOCLOPRAMIDE HCL 10 MG PO TABS: 5.0000 mg | ORAL_TABLET | Freq: Three times a day (TID) | ORAL | Status: DC | PRN

## 2011-02-05 MED ORDER — METOCLOPRAMIDE HCL 5 MG/ML IJ SOLN
5.0000 mg | Freq: Three times a day (TID) | INTRAMUSCULAR | Status: DC | PRN
  Filled 2011-02-05: qty 2

## 2011-02-05 MED ORDER — FENTANYL CITRATE 0.05 MG/ML IJ SOLN
INTRAMUSCULAR | Status: DC | PRN
  Administered 2011-02-05 (×2): 50 ug via INTRAVENOUS

## 2011-02-05 MED ORDER — CEFAZOLIN SODIUM 1-5 GM-% IV SOLN
INTRAVENOUS | Status: AC
  Filled 2011-02-05: qty 50

## 2011-02-05 MED ORDER — PROMETHAZINE HCL 25 MG/ML IJ SOLN
6.2500 mg | INTRAMUSCULAR | Status: AC | PRN
  Administered 2011-02-05 (×2): 0.625 mg via INTRAVENOUS

## 2011-02-05 MED ORDER — METHOCARBAMOL 500 MG PO TABS
500.0000 mg | ORAL_TABLET | Freq: Four times a day (QID) | ORAL | Status: DC | PRN
  Filled 2011-02-05: qty 1

## 2011-02-05 MED ORDER — HYDROCODONE-ACETAMINOPHEN 5-325 MG PO TABS
1.0000 | ORAL_TABLET | ORAL | Status: DC | PRN
  Administered 2011-02-07: 2 via ORAL
  Filled 2011-02-05: qty 2

## 2011-02-05 MED ORDER — SIMVASTATIN 5 MG PO TABS
5.0000 mg | ORAL_TABLET | Freq: Every day | ORAL | Status: DC
  Administered 2011-02-06: 5 mg via ORAL
  Filled 2011-02-05 (×2): qty 1

## 2011-02-05 MED ORDER — PROPOFOL 10 MG/ML IV BOLUS
INTRAVENOUS | Status: DC | PRN
  Administered 2011-02-05: 180 mg via INTRAVENOUS

## 2011-02-05 MED ORDER — INSULIN GLARGINE 100 UNIT/ML ~~LOC~~ SOLN: 35.0000 [IU] | Freq: Every day | SUBCUTANEOUS | Status: DC

## 2011-02-05 MED ORDER — ENALAPRIL MALEATE 10 MG PO TABS
10.0000 mg | ORAL_TABLET | Freq: Every day | ORAL | Status: DC
  Administered 2011-02-05 – 2011-02-08 (×4): 10 mg via ORAL
  Filled 2011-02-05 (×4): qty 1

## 2011-02-05 SURGICAL SUPPLY — 45 items
BANDAGE ESMARK 6X9 LF (GAUZE/BANDAGES/DRESSINGS) ×1 IMPLANT
BANDAGE GAUZE ELAST BULKY 4 IN (GAUZE/BANDAGES/DRESSINGS) ×8 IMPLANT
BLADE SAW RECIP 87.9 MT (BLADE) ×2 IMPLANT
BLADE SURG 21 STRL SS (BLADE) ×2 IMPLANT
BNDG COHESIVE 6X5 TAN STRL LF (GAUZE/BANDAGES/DRESSINGS) ×4 IMPLANT
BNDG ESMARK 6X9 LF (GAUZE/BANDAGES/DRESSINGS) ×2
CLOTH BEACON ORANGE TIMEOUT ST (SAFETY) ×2 IMPLANT
COVER SURGICAL LIGHT HANDLE (MISCELLANEOUS) ×2 IMPLANT
CUFF TOURNIQUET SINGLE 34IN LL (TOURNIQUET CUFF) ×2 IMPLANT
CUFF TOURNIQUET SINGLE 44IN (TOURNIQUET CUFF) IMPLANT
DRAIN PENROSE 1/2X12 LTX STRL (WOUND CARE) IMPLANT
DRAPE EXTREMITY T 121X128X90 (DRAPE) ×2 IMPLANT
DRAPE PROXIMA HALF (DRAPES) ×4 IMPLANT
DRAPE U-SHAPE 47X51 STRL (DRAPES) ×4 IMPLANT
DRSG ADAPTIC 3X8 NADH LF (GAUZE/BANDAGES/DRESSINGS) ×4 IMPLANT
DRSG PAD ABDOMINAL 8X10 ST (GAUZE/BANDAGES/DRESSINGS) ×8 IMPLANT
DURAPREP 26ML APPLICATOR (WOUND CARE) ×2 IMPLANT
ELECT REM PT RETURN 9FT ADLT (ELECTROSURGICAL) ×2
ELECTRODE REM PT RTRN 9FT ADLT (ELECTROSURGICAL) ×1 IMPLANT
EVACUATOR 1/8 PVC DRAIN (DRAIN) IMPLANT
GLOVE BIOGEL PI IND STRL 9 (GLOVE) ×1 IMPLANT
GLOVE BIOGEL PI INDICATOR 9 (GLOVE) ×1
GLOVE SURG ORTHO 9.0 STRL STRW (GLOVE) ×2 IMPLANT
GOWN PREVENTION PLUS XLARGE (GOWN DISPOSABLE) ×2 IMPLANT
GOWN SRG XL XLNG 56XLVL 4 (GOWN DISPOSABLE) ×1 IMPLANT
GOWN STRL NON-REIN XL XLG LVL4 (GOWN DISPOSABLE) ×1
KIT BASIN OR (CUSTOM PROCEDURE TRAY) ×2 IMPLANT
KIT ROOM TURNOVER OR (KITS) ×2 IMPLANT
MANIFOLD NEPTUNE II (INSTRUMENTS) ×2 IMPLANT
NS IRRIG 1000ML POUR BTL (IV SOLUTION) ×2 IMPLANT
PACK GENERAL/GYN (CUSTOM PROCEDURE TRAY) ×2 IMPLANT
PAD ARMBOARD 7.5X6 YLW CONV (MISCELLANEOUS) ×4 IMPLANT
SPONGE GAUZE 4X4 12PLY (GAUZE/BANDAGES/DRESSINGS) ×4 IMPLANT
SPONGE LAP 18X18 X RAY DECT (DISPOSABLE) ×2 IMPLANT
STAPLER VISISTAT 35W (STAPLE) ×2 IMPLANT
STOCKINETTE IMPERVIOUS LG (DRAPES) ×2 IMPLANT
SUT ETHILON 2 0 PSLX (SUTURE) ×2 IMPLANT
SUT PDS AB 1 CT  36 (SUTURE) ×2
SUT PDS AB 1 CT 36 (SUTURE) ×2 IMPLANT
SUT SILK 2 0 (SUTURE) ×1
SUT SILK 2-0 18XBRD TIE 12 (SUTURE) ×1 IMPLANT
TOWEL OR 17X24 6PK STRL BLUE (TOWEL DISPOSABLE) ×2 IMPLANT
TOWEL OR 17X26 10 PK STRL BLUE (TOWEL DISPOSABLE) ×2 IMPLANT
TUBE ANAEROBIC SPECIMEN COL (MISCELLANEOUS) IMPLANT
WATER STERILE IRR 1000ML POUR (IV SOLUTION) ×2 IMPLANT

## 2011-02-05 NOTE — Progress Notes (Signed)
Ekg from 09/2010 showed abnormality.  A. Zelnak,PA, notified.  She ordered EKG to be redone and then will review.//L. Agamjot Kilgallon,RN

## 2011-02-05 NOTE — Anesthesia Preprocedure Evaluation (Addendum)
Anesthesia Evaluation  Patient identified by MRN, date of birth, ID band Patient awake    Reviewed: Allergy & Precautions, H&P , NPO status , Patient's Chart, lab work & pertinent test results  Airway Mallampati: II TM Distance: >3 FB Neck ROM: Full    Dental  (+) Teeth Intact and Dental Advisory Given   Pulmonary  clear to auscultation        Cardiovascular hypertension, Pt. on medications Regular Normal    Neuro/Psych  Headaches,    GI/Hepatic   Endo/Other  Diabetes mellitus-, Well Controlled, Type 1, Insulin Dependent  Renal/GU   Genitourinary negative   Musculoskeletal negative musculoskeletal ROS (+)   Abdominal   Peds negative pediatric ROS (+)  Hematology negative hematology ROS (+)   Anesthesia Other Findings   Reproductive/Obstetrics negative OB ROS                          Anesthesia Physical Anesthesia Plan  ASA: III  Anesthesia Plan: General   Post-op Pain Management:    Induction: Intravenous  Airway Management Planned: LMA  Additional Equipment:   Intra-op Plan:   Post-operative Plan: Extubation in OR  Informed Consent: I have reviewed the patients History and Physical, chart, labs and discussed the procedure including the risks, benefits and alternatives for the proposed anesthesia with the patient or authorized representative who has indicated his/her understanding and acceptance.     Plan Discussed with: CRNA  Anesthesia Plan Comments:         Anesthesia Quick Evaluation

## 2011-02-05 NOTE — Preoperative (Signed)
Beta Blockers   Reason not to administer Beta Blockers:Not Applicable 

## 2011-02-05 NOTE — Anesthesia Procedure Notes (Addendum)
Procedure Name: LMA Insertion Date/Time: 02/05/2011 6:42 PM Performed by: Julianne Rice Z Pre-anesthesia Checklist: Patient identified, Timeout performed, Emergency Drugs available, Suction available and Patient being monitored Patient Re-evaluated:Patient Re-evaluated prior to inductionOxygen Delivery Method: Circle System Utilized Preoxygenation: Pre-oxygenation with 100% oxygen Intubation Type: IV induction Ventilation: Mask ventilation without difficulty LMA Size: 4.0 Tube type: Oral Number of attempts: 1 Placement Confirmation: breath sounds checked- equal and bilateral Tube secured with: Tape Dental Injury: Teeth and Oropharynx as per pre-operative assessment

## 2011-02-05 NOTE — H&P (Signed)
Raven Watson is an 50 y.o. female.   Chief Complaint: Ulcer osteomyelitis Charcot collapse right foot HPI: Patient is status post surgical intervention for Charcot collapse of the right foot. She subsequently underwent multiple foot salvage surgeries for infection in the bone and foot. Patient has shown progressive lytic changes in the hindfoot consistent with progressive Charcot changes osteomyelitis and varus deformity of her foot to the point where she does not have a foot which is able to bear weight for ambulation  Past Medical History  Diagnosis Date  . 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 Diabetes mellitus   . 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 Anemia 08/30/2010    microcytic anemia  . Hypertension   . Blood transfusion   . Headache     Past Surgical History  Procedure Date  . Foot surgery 03/2010    thin wire placement, Charcot joint    History reviewed. No pertinent family history. Social History:  reports that she has quit smoking. She has never used smokeless tobacco. She reports that she does not drink alcohol or use illicit drugs.  Allergies: No Known Allergies  Medications Prior to Admission  Medication Dose Route Frequency Provider Last Rate Last Dose  . ceFAZolin (ANCEF) IVPB 1 g/50 mL premix  1 g Intravenous 60 min Pre-Op Nadara Mustard, MD       Medications Prior to Admission  Medication Sig Dispense Refill  . aspirin 81 MG tablet Take 81 mg by mouth daily.        . enalapril (VASOTEC) 10 MG tablet Take 10 mg by mouth daily.        . insulin aspart (NOVOLOG) 100 UNIT/ML injection Inject 5 Units into the skin 3 (three) times daily before meals. Sliding scale      . insulin glargine (LANTUS) 100 UNIT/ML injection Inject 35 Units into the skin at bedtime.       . lovastatin (MEVACOR) 40 MG tablet Take 80 mg by mouth at  bedtime.          No results found for this or any previous visit (from the past 48 hour(s)). No results found.  Review of Systems  All other systems reviewed and are negative.    There were no vitals taken for this visit. Physical Exam  On examination patient has a palpable dorsalis pedis pulse she has a large ulcer over the lateral aspect of her foot. This does probe down to bone. The radiographs shows destructive lytic change lytic changes at the hindfoot consistent with osteomyelitis and Charcot collapse Assessment/Plan Assessment Charcot collapse osteomyelitis right foot with ulceration. Plan plan for transtibial amputation. Risks and benefits were discussed including infection neurovascular injury persistent pain nonhealing of the wound need for additional surgery patient states she understands and wishes to proceed at this time.  Bethany Hirt V 02/05/2011, 6:00 AM

## 2011-02-05 NOTE — H&P (Signed)
Raven Watson is an 50 y.o. female.   Chief Complaint: Osteomyelitis Charcot collapse Wagner grade 3 ulcer right foot  HPI: Patient has undergone prolonged foot salvage surgery for attempt at foot salvage on the right. She has had progressive Charcot collapse progressive lytic changes progressive ulceration presents at this time for a transtibial amputation.  Past Medical History  Diagnosis Date  . 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 Diabetes mellitus   . 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 Anemia 08/30/2010    microcytic anemia  . Hypertension   . Blood transfusion   . Headache     Past Surgical History  Procedure Date  . Foot surgery 03/2010    thin wire placement, Charcot joint    History reviewed. No pertinent family history. Social History:  reports that she has quit smoking. She has never used smokeless tobacco. She reports that she does not drink alcohol or use illicit drugs.  Allergies: No Known Allergies  Medications Prior to Admission  Medication Dose Route Frequency Provider Last Rate Last Dose  . ceFAZolin (ANCEF) IVPB 1 g/50 mL premix  1 g Intravenous 60 min Pre-Op Nadara Mustard, MD      . mupirocin ointment Idelle Jo) 2 %   Nasal Once Nadara Mustard, MD      . mupirocin ointment (BACTROBAN) 2 %            Medications Prior to Admission  Medication Sig Dispense Refill  . aspirin 81 MG tablet Take 81 mg by mouth daily.        . enalapril (VASOTEC) 10 MG tablet Take 10 mg by mouth daily.        . insulin aspart (NOVOLOG) 100 UNIT/ML injection Inject 5 Units into the skin 3 (three) times daily before meals. Sliding scale      . insulin glargine (LANTUS) 100 UNIT/ML injection Inject 35 Units into the skin at bedtime.       . lovastatin (MEVACOR) 40 MG tablet Take 80 mg by mouth at bedtime.          Results for orders  placed during the hospital encounter of 02/05/11 (from the past 48 hour(s))  APTT     Status: Abnormal   Collection Time   02/05/11  2:05 PM      Component Value Range Comment   aPTT 41 (*) 24 - 37 (seconds)   CBC     Status: Abnormal   Collection Time   02/05/11  2:05 PM      Component Value Range Comment   WBC 12.0 (*) 4.0 - 10.5 (K/uL)    RBC 4.50  3.87 - 5.11 (MIL/uL)    Hemoglobin 11.8 (*) 12.0 - 15.0 (g/dL)    HCT 45.4 (*) 09.8 - 46.0 (%)    MCV 78.4  78.0 - 100.0 (fL)    MCH 26.2  26.0 - 34.0 (pg)    MCHC 33.4  30.0 - 36.0 (g/dL)    RDW 11.9 (*) 14.7 - 15.5 (%)    Platelets 509 (*) 150 - 400 (K/uL)   COMPREHENSIVE METABOLIC PANEL     Status: Abnormal   Collection Time   02/05/11  2:05 PM      Component Value Range Comment   Sodium 138  135 - 145 (mEq/L)    Potassium 4.1  3.5 - 5.1 (mEq/L)  Chloride 103  96 - 112 (mEq/L)    CO2 24  19 - 32 (mEq/L)    Glucose, Bld 135 (*) 70 - 99 (mg/dL)    BUN 26 (*) 6 - 23 (mg/dL)    Creatinine, Ser 1.61  0.50 - 1.10 (mg/dL)    Calcium 9.9  8.4 - 10.5 (mg/dL)    Total Protein 6.9  6.0 - 8.3 (g/dL)    Albumin 2.4 (*) 3.5 - 5.2 (g/dL)    AST 18  0 - 37 (U/L)    ALT 7  0 - 35 (U/L)    Alkaline Phosphatase 206 (*) 39 - 117 (U/L)    Total Bilirubin 0.2 (*) 0.3 - 1.2 (mg/dL)    GFR calc non Af Amer 67 (*) >90 (mL/min)    GFR calc Af Amer 78 (*) >90 (mL/min)   PROTIME-INR     Status: Normal   Collection Time   02/05/11  2:05 PM      Component Value Range Comment   Prothrombin Time 14.4  11.6 - 15.2 (seconds)    INR 1.10  0.00 - 1.49    GLUCOSE, CAPILLARY     Status: Abnormal   Collection Time   02/05/11  2:09 PM      Component Value Range Comment   Glucose-Capillary 125 (*) 70 - 99 (mg/dL)    Dg Chest 2 View  09/60/4540  *RADIOLOGY REPORT*  Clinical Data: Preop for hypertension.  Diabetes.  Right foot surgery.  CHEST - 2 VIEW  Comparison: 10/24/2010  Findings: The small bilateral pleural effusions have resolved. Midline trachea.   Moderate enlargement of the cardiopericardial silhouette is not significantly changed. Mediastinal contours otherwise within normal limits.  No congestive failure.  No pneumothorax.  Biapical pleural thickening. Clear lungs.  IMPRESSION: Moderate cardiomegaly, without acute disease.  Original Report Authenticated By: Consuello Bossier, M.D.    Review of Systems  All other systems reviewed and are negative.    Blood pressure 124/81, pulse 100, temperature 97.9 F (36.6 C), temperature source Oral, resp. rate 20, height 5\' 8"  (1.727 m), weight 93.895 kg (207 lb), SpO2 98.00%. Physical Exam  On examination she has an ulcer which extends down to bone. Radiograph shows lytic changes of the tibial talar joint with essentially complete dissolution of the talus with Charcot lytic changes consistent with a Charcot arthropathy and osteomyelitis. 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 Plan we will plan for a transtibial amputation. Risks and benefits were discussed including infection neurovascular injury persistent pain need for additional surgery. Patient states he understands and wishes to proceed at this time.  [name] 02/05/2011, 3:49 PM

## 2011-02-05 NOTE — Transfer of Care (Signed)
Immediate Anesthesia Transfer of Care Note  Patient: Raven Watson  Procedure(s) Performed:  AMPUTATION BELOW KNEE - Right Below Knee Amputation  Patient Location: PACU  Anesthesia Type: General  Level of Consciousness: awake, alert  and oriented  Airway & Oxygen Therapy: Patient Spontanous Breathing and Patient connected to nasal cannula oxygen  Post-op Assessment: Report given to PACU RN and Post -op Vital signs reviewed and stable  Post vital signs: Reviewed and stable  Complications: No apparent anesthesia complications

## 2011-02-05 NOTE — Op Note (Signed)
OPERATIVE REPORT  DATE OF SURGERY: 02/05/2011  PATIENT:  Raven Watson,  50 y.o. female  PRE-OPERATIVE DIAGNOSIS:  Right Foot Osteomyelitis and Ulcer  POST-OPERATIVE DIAGNOSIS:  Right foot osteomyelitis and ulcer   PROCEDURE:  Procedure(s): AMPUTATION BELOW KNEE  SURGEON:  Surgeon(s): Nadara Mustard, MD  ANESTHESIA:   general  EBL:  min ML  SPECIMEN:  Source of Specimen:  right leg  TOURNIQUET: 7 min  PROCEDURE DETAILS: Patient is a 50 year old woman with diabetic insensate neuropathy status post Charcot collapse status post internal fixation for Charcot collapse he subsequently developed infection and has failed conservative treatment for wound management debridement of osteomyelitis and unstable Charcot collapse with a varus collapse of her foot. 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 Risks and benefits of surgery were discussed including infection neurovascular injury nonhealing of the wound need for higher level amputation patient states she understands and wishes to proceed at this time. Description of procedure patient brought to or room tendon underwent a general anesthetic. After adequate levels and anesthesia were obtained patient's right lower extremity was prepped using DuraPrep draped into a sterile field. A transverse incision was made 11 cm distal the tibial tubercle was curved proximally and a large posterior flap was created. The tibia was transected and beveled about 1 cm proximal to the skin incision the fibula was transected just proximal to the tibial incision. An amputation knife was used to create a large posterior flap. The sciatic nerve was pulled cut and allowed to retract. The vascular bundles were suture ligated with 2-0 silk. Tourniquet was deflated after 7 minutes hemostasis was obtained. The deep and superficial fascial layers were closed using #1  PDS. The skin was closed using approximate staples and 2-0 nylon. The wound is covered with Adaptic orthopedic sponges AB dressing Kerlix and Coban. Patient was extubated taken the PACU in stable condition.  PLAN OF CARE: Admit to inpatient   PATIENT DISPOSITION:  PACU - hemodynamically stable.   Nadara Mustard, MD 02/05/2011 7:25 PM

## 2011-02-05 NOTE — Progress Notes (Signed)
ANTICOAGULATION CONSULT NOTE - Initial Consult  Pharmacy Consult forWarfarin Indication: VTE prophylaxis  No Known Allergies  Patient Measurements: Height: 5\' 8"  (172.7 cm) Weight: 208 lb 5.4 oz (94.5 kg) IBW/kg (Calculated) : 63.9  Vital Signs: Temp: 98.4 F (36.9 C) (12/11 2138) Temp src: Oral (12/11 2138) BP: 158/83 mmHg (12/11 2138) Pulse Rate: 96  (12/11 2138)  Labs:  Basename 02/05/11 1405  HGB 11.8*  HCT 35.3*  PLT 509*  APTT 41*  LABPROT 14.4  INR 1.10  HEPARINUNFRC --  CREATININE 0.98  CKTOTAL --  CKMB --  TROPONINI --   Estimated Creatinine Clearance: 84.3 ml/min (by C-G formula based on Cr of 0.98).  Medical History: Past Medical History  Diagnosis Date  . 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 Diabetes mellitus   . 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 Anemia 08/30/2010    microcytic anemia  . Hypertension   . Blood transfusion   . Headache     Medications:  Prescriptions prior to admission  Medication Sig Dispense Refill  . aspirin 81 MG tablet Take 81 mg by mouth daily.        Marland Kitchen docusate sodium (COLACE) 100 MG capsule Take 100 mg by mouth 2 (two) times daily.        . enalapril (VASOTEC) 10 MG tablet Take 10 mg by mouth daily.        Marland Kitchen gemfibrozil (LOPID) 600 MG tablet Take 600 mg by mouth 2 (two) times daily before a meal.        . hydrochlorothiazide (MICROZIDE) 12.5 MG capsule Take 12.5 mg by mouth daily.        . insulin aspart (NOVOLOG) 100 UNIT/ML injection Inject 5 Units into the skin 3 (three) times daily before meals. Sliding scale      . insulin glargine (LANTUS) 100 UNIT/ML injection Inject 35 Units into the skin at bedtime.       . lovastatin (MEVACOR) 40 MG tablet Take 80 mg by mouth at bedtime.          Assessment: 50 year old woman s/p right foot amputation to start on Warfarin for VTE  prophylaxis. Goal of Therapy:  INR 2-3   Plan:  Warfarin 10mg  PO today.  Daily protimes.   Mickeal Skinner 02/05/2011,10:14 PM

## 2011-02-05 NOTE — Anesthesia Postprocedure Evaluation (Signed)
  Anesthesia Post-op Note  Patient: Raven Watson  Procedure(s) Performed:  AMPUTATION BELOW KNEE - Right Below Knee Amputation  Patient Location: PACU  Anesthesia Type: General  Level of Consciousness: awake, alert  and oriented  Airway and Oxygen Therapy: Patient Spontanous Breathing  Post-op Pain: mild  Post-op Assessment: Post-op Vital signs reviewed, Patient's Cardiovascular Status Stable, Respiratory Function Stable, Patent Airway, No signs of Nausea or vomiting and Pain level controlled  Post-op Vital Signs: stable  Complications: No apparent anesthesia complications

## 2011-02-06 ENCOUNTER — Encounter (HOSPITAL_COMMUNITY): Payer: Self-pay | Admitting: General Practice

## 2011-02-06 LAB — COMPREHENSIVE METABOLIC PANEL
ALT: 7 U/L (ref 0–35)
AST: 18 U/L (ref 0–37)
Alkaline Phosphatase: 158 U/L — ABNORMAL HIGH (ref 39–117)
Calcium: 9.4 mg/dL (ref 8.4–10.5)
GFR calc Af Amer: 73 mL/min — ABNORMAL LOW (ref >90)
Glucose, Bld: 205 mg/dL — ABNORMAL HIGH (ref 70–99)
Potassium: 4.2 mEq/L (ref 3.5–5.1)
Sodium: 136 mEq/L (ref 135–145)
Total Protein: 6.8 g/dL (ref 6.0–8.3)

## 2011-02-06 LAB — GLUCOSE, CAPILLARY
Glucose-Capillary: 112 mg/dL — ABNORMAL HIGH (ref 70–99)
Glucose-Capillary: 95 mg/dL (ref 70–99)

## 2011-02-06 LAB — PROTIME-INR: INR: 1.15 (ref 0.00–1.49)

## 2011-02-06 MED ORDER — WARFARIN SODIUM 10 MG PO TABS
10.0000 mg | ORAL_TABLET | Freq: Once | ORAL | Status: AC
  Administered 2011-02-06: 10 mg via ORAL
  Filled 2011-02-06: qty 1

## 2011-02-06 NOTE — Progress Notes (Signed)
Physical Therapy Evaluation Patient Details Name: Raven Watson MRN: [id number] DOB: [birth year] Today's Date: 02/06/2011  Problem List:  Patient Active Problem List  Diagnoses  . 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 MSSA (methicillin susceptible Staphylococcus aureus) infection  . Anemia  . Hypertension    Past Medical History:  Past Medical History  Diagnosis Date  . 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 Diabetes mellitus   . 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 Anemia 08/30/2010    microcytic anemia  . Hypertension   . Headache   . Hypercholesteremia   . Blood transfusion 08/2010   Past Surgical History:  Past Surgical History  Procedure Date  . Foot surgery 03/2010    thin wire placement, Charcot joint; right  . Leg amputation below knee 02/05/11    right  . Tonsillectomy     "when I was a small child"    PT Assessment/Plan/Recommendation PT Assessment Clinical Impression Statement: Pt is 50 yo female who has undergone Right BKA after multiple amputation surgeries to the right foot.  She is tolerating mobililty well but needs skilled PT to educate her on proper positioning and exercises to prepare the leg for a future prosthesis as well as help her safely accomodate to new weight differential.Recommend HHPT at d/c.  PT Recommendation/Assessment: Patient will need skilled PT in the acute care venue PT Problem List: Decreased balance;Decreased mobility;Decreased knowledge of precautions Barriers to Discharge: None PT Therapy Diagnosis : Difficulty walking;Abnormality of gait PT Plan PT Frequency: Min 5X/week PT Treatment/Interventions: DME instruction;Gait training;Therapeutic activities;Therapeutic exercise;Balance training;Patient/family education PT Recommendation Follow Up Recommendations: Home health  PT Equipment Recommended: None recommended by PT PT Goals  Acute Rehab PT Goals PT Goal Formulation: With patient Time For Goal Achievement: 7 days Pt will go Supine/Side to Sit: with modified independence;with HOB 0 degrees PT Goal: Supine/Side to Sit - Progress: Progressing toward goal Pt will go Sit to Supine/Side: with modified independence;with HOB 0 degrees PT Goal: Sit to Supine/Side - Progress: Other (comment) (NT) Pt will go Sit to Stand: with modified independence PT Goal: Sit to Stand - Progress: Progressing toward goal Pt will go Stand to Sit: with modified independence PT Goal: Stand to Sit - Progress: Progressing toward goal Pt will Transfer Bed to Chair/Chair to Bed: with modified independence;Other (comment) (with RW) PT Transfer Goal: Bed to Chair/Chair to Bed - Progress: Progressing toward goal Pt will Ambulate: 16 - 50 feet;with modified independence;with rolling walker PT Goal: Ambulate - Progress: Progressing toward goal Pt will Perform Home Exercise Program: Independently PT Goal: Perform Home Exercise Program - Progress: Progressing toward goal  PT Evaluation Precautions/Restrictions  Precautions Precautions: Fall Required Braces or Orthoses: No Restrictions Weight Bearing Restrictions:  (NWB RLE due to amputation) Other Position/Activity Restrictions: BKA positioning Prior Functioning  Home Living Lives With: Other (Comment) (roommate) Receives Help From: Family (supportive family, multiple sisters, roommate home @night ) Type of Home: House Home Layout: One level Home Access: Stairs to enter Entrance Stairs-Rails: None Entrance Stairs-Number of Steps: 3 (family takes pt up stairs in w/c) Bathroom Shower/Tub:  (sponge baths) Bathroom Toilet:  (uses BSC) Bathroom Accessibility: No Home Adaptive Equipment: Bedside commode/3-in-1;Wheelchair - manual;Walker - rolling Prior Function Level of Independence: Requires assistive device for independence;Needs  assistance with homemaking;Needs assistance with ADLs Able to Take Stairs?: No Driving: No Cognition  Cognition Arousal/Alertness: Awake/alert Overall Cognitive Status: Appears within functional limits for tasks assessed Orientation Level: Oriented X4 Sensation/Coordination Sensation Light Touch: Appears Intact Stereognosis: Appears Intact Proprioception: Appears Intact Coordination Gross Motor Movements are Fluid and Coordinated: Yes Fine Motor Movements are Fluid and Coordinated: Yes Extremity Assessment RUE Assessment RUE Assessment: Within Functional Limits LUE Assessment LUE Assessment: Within Functional Limits RLE Assessment RLE Assessment: Within Functional Limits (hip and knee appear WFL at this time) LLE Assessment LLE Assessment: Within Functional Limits Mobility (including Balance) Bed Mobility Bed Mobility: Yes Supine to Sit: 5: Supervision;With rails;HOB flat Supine to Sit Details (indicate cue type and reason): Pt able to perform supine-sit without assistance Sitting - Scoot to Edge of Bed: 6: Modified independent (Device/Increase time) Transfers Transfers: Yes Sit to Stand: 4: Min assist;From bed;With upper extremity assist (min-guard A) Sit to Stand Details (indicate cue type and reason): vc's for safety and allowing for decreased wt of RLE Stand to Sit: 4: Min assist;To chair/3-in-1;With armrests (min-guard A) Stand to Sit Details: safe sitting technique with good control Ambulation/Gait Ambulation/Gait: Yes Ambulation/Gait Assistance: 4: Min assist Ambulation/Gait Assistance Details (indicate cue type and reason): vc's given for pt to not rush and self-monitor.  Education given on proper RLE position while ambulating, that is, keeping the leg down by the left. Ambulation Distance (Feet): 12 Feet Assistive device: Rolling walker Gait Pattern:  (single leg hop) Stairs: No Wheelchair Mobility Wheelchair Mobility: No  Posture/Postural  Control Posture/Postural Control: No significant limitations Balance Balance Assessed: No Exercise  Amputee Exercises Quad Sets: Both;AROM;Strengthening;10 reps;Supine;Seated Gluteal Sets: 10 reps;Both;AROM;Strengthening;Supine Hip Extension: Standing;Supine;10 reps;Right;Strengthening;AROM Knee Flexion: Seated;10 reps;Right;AROM Knee Extension: Seated;10 reps;Right;Strengthening;AROM  pt given handout of amputee exercises and also educated on proper positioning of residual limb in supine, sitting, and standing End of Session PT - End of Session Equipment Utilized During Treatment: Gait belt Activity Tolerance: Patient tolerated treatment well Patient left: in chair;with family/visitor present;with call bell in reach Nurse Communication: Mobility status for ambulation General Behavior During Session: [facility] for tasks performed Cognition: [facility] for tasks performed  Raven Watson, [place]  [phone] 02/06/2011, 1:42 PM

## 2011-02-06 NOTE — Progress Notes (Signed)
ANTICOAGULATION CONSULT NOTE - Follow Up  Pharmacy Consult for Warfarin Indication: VTE prophylaxis  No Known Allergies  Patient Measurements: Height: 5\' 8"  (172.7 cm) Weight: 208 lb 5.4 oz (94.5 kg) IBW/kg (Calculated) : 63.9  Vital Signs: Temp: 98.2 F (36.8 C) (12/12 0631) Temp src: Oral (12/12 0631) BP: 129/79 mmHg (12/12 0631) Pulse Rate: 91  (12/12 0631)  Labs:  Basename 02/06/11 0640 02/05/11 1405  HGB -- 11.8*  HCT -- 35.3*  PLT -- 509*  APTT -- 41*  LABPROT 14.9 14.4  INR 1.15 1.10  HEPARINUNFRC -- --  CREATININE 1.03 0.98  CKTOTAL -- --  CKMB -- --  TROPONINI -- --   Estimated Creatinine Clearance: 80.2 ml/min (by C-G formula based on Cr of 1.03).  Medical History: Past Medical History  Diagnosis Date  . 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 Diabetes mellitus   . 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 Anemia 08/30/2010    microcytic anemia  . Hypertension   . Headache   . Hypercholesteremia   . Blood transfusion 08/2010    Medications:  Prescriptions prior to admission  Medication Sig Dispense Refill  . aspirin 81 MG tablet Take 81 mg by mouth daily.        Marland Kitchen docusate sodium (COLACE) 100 MG capsule Take 100 mg by mouth 2 (two) times daily.        . enalapril (VASOTEC) 10 MG tablet Take 10 mg by mouth daily.        Marland Kitchen gemfibrozil (LOPID) 600 MG tablet Take 600 mg by mouth 2 (two) times daily before a meal.        . hydrochlorothiazide (MICROZIDE) 12.5 MG capsule Take 12.5 mg by mouth daily.        . insulin aspart (NOVOLOG) 100 UNIT/ML injection Inject 5 Units into the skin 3 (three) times daily before meals. Sliding scale      . insulin glargine (LANTUS) 100 UNIT/ML injection Inject 35 Units into the skin at bedtime.       . lovastatin (MEVACOR) 40 MG tablet Take 80 mg by mouth at bedtime.           Assessment: 50 year old woman s/p right foot amputation. INR is below-goal.  Goal of Therapy:  INR 2-3   Plan:  1. Coumadin 10mg  po today. 2. Follow-up AM labs.  Emeline Gins 02/06/2011,9:02 AM

## 2011-02-06 NOTE — Progress Notes (Signed)
Subjective: 1 Day Post-Op Procedure(s) (LRB): AMPUTATION BELOW KNEE (Right) Patient reports phantom pain this morning.    Objective: Vital signs in last 24 hours: Temp:  [97.8 F (36.6 C)-98.6 F (37 C)] 98.2 F (36.8 C) (12/12 0631) Pulse Rate:  [86-100] 91  (12/12 0631) Resp:  [15-22] 18  (12/12 0631) BP: (124-176)/(79-106) 129/79 mmHg (12/12 0631) SpO2:  [93 %-100 %] 98 % (12/12 0631) Weight:  [93.895 kg (207 lb)-94.5 kg (208 lb 5.4 oz)] 208 lb 5.4 oz (94.5 kg) (12/11 2138)  Intake/Output from previous day: 12/11 0701 - 12/12 0700 In: 1100 [I.Watson.:1100] Out: 275 [Emesis/NG output:125; Blood:150] Intake/Output this shift: Total I/O In: 500 [I.Watson.:500] Out: 275 [Emesis/NG output:125; Blood:150]   Basename 02/05/11 1405  HGB 11.8*    Basename 02/05/11 1405  WBC 12.0*  RBC 4.50  HCT 35.3*  PLT 509*    Basename 02/05/11 1405  NA 138  K 4.1  CL 103  CO2 24  BUN 26*  CREATININE 0.98  GLUCOSE 135*  CALCIUM 9.9    Basename 02/05/11 1405  LABPT --  INR 1.10    No cellulitis present  Assessment/Plan: 1 Day Post-Op Procedure(s) (LRB): AMPUTATION BELOW KNEE (Right) Discharge to SNF  Raven Watson 02/06/2011, 6:38 AM

## 2011-02-07 ENCOUNTER — Encounter (HOSPITAL_COMMUNITY): Payer: Self-pay | Admitting: Orthopedic Surgery

## 2011-02-07 LAB — COMPREHENSIVE METABOLIC PANEL
ALT: <5 U/L (ref 0–35)
AST: 17 U/L (ref 0–37)
Albumin: 2.2 g/dL — ABNORMAL LOW (ref 3.5–5.2)
Alkaline Phosphatase: 136 U/L — ABNORMAL HIGH (ref 39–117)
Chloride: 104 mEq/L (ref 96–112)
Potassium: 3.9 mEq/L (ref 3.5–5.1)
Sodium: 137 mEq/L (ref 135–145)
Total Bilirubin: 0.1 mg/dL — ABNORMAL LOW (ref 0.3–1.2)
Total Protein: 6.9 g/dL (ref 6.0–8.3)

## 2011-02-07 LAB — GLUCOSE, CAPILLARY
Glucose-Capillary: 73 mg/dL (ref 70–99)
Glucose-Capillary: 101 mg/dL — ABNORMAL HIGH (ref 70–99)

## 2011-02-07 LAB — PROTIME-INR
INR: 1.44 (ref 0.00–1.49)
Prothrombin Time: 17.8 seconds — ABNORMAL HIGH (ref 11.6–15.2)

## 2011-02-07 MED ORDER — WARFARIN SODIUM 5 MG PO TABS
5.0000 mg | ORAL_TABLET | Freq: Once | ORAL | Status: AC
  Administered 2011-02-07: 5 mg via ORAL
  Filled 2011-02-07: qty 1

## 2011-02-07 MED ORDER — HYDROXYZINE HCL 25 MG PO TABS
25.0000 mg | ORAL_TABLET | Freq: Four times a day (QID) | ORAL | Status: DC | PRN
  Administered 2011-02-07 – 2011-02-08 (×2): 25 mg via ORAL
  Filled 2011-02-07 (×2): qty 1

## 2011-02-07 MED ORDER — DIPHENHYDRAMINE HCL 25 MG PO CAPS
25.0000 mg | ORAL_CAPSULE | Freq: Four times a day (QID) | ORAL | Status: DC | PRN
  Administered 2011-02-07 (×2): 25 mg via ORAL
  Filled 2011-02-07 (×2): qty 1

## 2011-02-07 MED ORDER — ROSUVASTATIN CALCIUM 10 MG PO TABS
10.0000 mg | ORAL_TABLET | Freq: Every day | ORAL | Status: DC
  Administered 2011-02-07: 10 mg via ORAL
  Filled 2011-02-07 (×2): qty 1

## 2011-02-07 NOTE — Progress Notes (Signed)
Seen and agree with SPT note Handsome Anglin Tabor, PT [phone]  

## 2011-02-07 NOTE — Progress Notes (Signed)
Subjective: 2 Days Post-Op Procedure(s) (LRB): AMPUTATION BELOW KNEE (Right) Progressing well with PT    Objective: Vital signs in last 24 hours: Temp:  [97.8 F (36.6 C)-99.3 F (37.4 C)] 99.3 F (37.4 C) (12/13 0542) Pulse Rate:  [91-99] 97  (12/13 0542) Resp:  [18] 18  (12/13 0542) BP: (110-120)/(73-77) 110/74 mmHg (12/13 0542) SpO2:  [97 %-99 %] 99 % (12/13 0542)  Intake/Output from previous day: 12/12 0701 - 12/13 0700 In: 649.2 [I.V.:649.2] Out: 1525 [Urine:1525] Intake/Output this shift:     Basename 02/05/11 1405  HGB 11.8*    Basename 02/05/11 1405  WBC 12.0*  RBC 4.50  HCT 35.3*  PLT 509*    Basename 02/06/11 0640 02/05/11 1405  NA 136 138  K 4.2 4.1  CL 101 103  CO2 23 24  BUN 27* 26*  CREATININE 1.03 0.98  GLUCOSE 205* 135*  CALCIUM 9.4 9.9    Basename 02/06/11 0640 02/05/11 1405  LABPT -- --  INR 1.15 1.10    Incision: dressing C/D/I  Assessment/Plan: 2 Days Post-Op Procedure(s) (LRB): AMPUTATION BELOW KNEE (Right) Up with therapy Discharge home with home health Good progress with PT plan for d/c to home  Raven Watson V 02/07/2011, 7:30 AM

## 2011-02-07 NOTE — Progress Notes (Signed)
IV NSL almost out of pt's hand upon my assessment.  D/C NSL.  Pt currently receiving no IV medications.

## 2011-02-07 NOTE — Progress Notes (Signed)
Pt complaining of itching.  Notified MD on call, Dr. Ophelia Charter, who ordered Benadryl 25mg  PO q6hrs prn itching.  Will continue to monitor.

## 2011-02-07 NOTE — Progress Notes (Signed)
ANTICOAGULATION CONSULT NOTE - Follow Up  Pharmacy Consult for Warfarin Indication: VTE prophylaxis  No Known Allergies  Patient Measurements: Height: 5\' 8"  (172.7 cm) Weight: 208 lb 5.4 oz (94.5 kg) IBW/kg (Calculated) : 63.9  Vital Signs: Temp: 99.3 F (37.4 C) (12/13 0542) Temp src: Oral (12/13 0542) BP: 116/67 mmHg (12/13 0926) Pulse Rate: 90  (12/13 0926)  Labs:  Basename 02/07/11 0635 02/06/11 0640 02/05/11 1405  HGB -- -- 11.8*  HCT -- -- 35.3*  PLT -- -- 509*  APTT -- -- 41*  LABPROT 17.8* 14.9 14.4  INR 1.44 1.15 1.10  HEPARINUNFRC -- -- --  CREATININE 1.05 1.03 0.98  CKTOTAL -- -- --  CKMB -- -- --  TROPONINI -- -- --   Estimated Creatinine Clearance: 78.7 ml/min (by C-G formula based on Cr of 1.05).  Medical History: Past Medical History  Diagnosis Date  . 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 Diabetes mellitus   . 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 Anemia 08/30/2010    microcytic anemia  . Hypertension   . Headache   . Hypercholesteremia   . Blood transfusion 08/2010    Medications:  Prescriptions prior to admission  Medication Sig Dispense Refill  . aspirin 81 MG tablet Take 81 mg by mouth daily.        Marland Kitchen docusate sodium (COLACE) 100 MG capsule Take 100 mg by mouth 2 (two) times daily.        . enalapril (VASOTEC) 10 MG tablet Take 10 mg by mouth daily.        Marland Kitchen gemfibrozil (LOPID) 600 MG tablet Take 600 mg by mouth 2 (two) times daily before a meal.        . hydrochlorothiazide (MICROZIDE) 12.5 MG capsule Take 12.5 mg by mouth daily.        . insulin aspart (NOVOLOG) 100 UNIT/ML injection Inject 5 Units into the skin 3 (three) times daily before meals. Sliding scale      . insulin glargine (LANTUS) 100 UNIT/ML injection Inject 35 Units into the skin at bedtime.       . lovastatin (MEVACOR) 40 MG tablet  Take 80 mg by mouth at bedtime.          Assessment: 50 year old woman s/p right foot amputation POD #2.  INR has increased but remains below-goal 2-3. No bleeding reported. Patient also taking gemfibrozil and statin which may increase coumadin's effect.   Goal of Therapy:  INR 2-3   Plan:  1. Coumadin 5mg  po today. 2. Follow-up AM labs.  Arman Filter 02/07/2011,11:22 AM

## 2011-02-07 NOTE — Progress Notes (Signed)
Physical Therapy Treatment Patient Details Name: Raven Watson MRN: [id number] DOB: 1960/03/24 Today's Date: 02/07/2011  PT Assessment/Plan  PT - Assessment/Plan Comments on Treatment Session: Pt. is progressing well with ambulation, able to increase her distance today with seated rest to use the restroom.  Pt. has tendency to rush and requires cues to slow down and for safety.  Reviewed LE exercises and encouraged pt. to perform them throughout the day.  Will continue to work on increasing ambulation distance and endurance prior to return home with family. PT Plan: Discharge plan remains appropriate Follow Up Recommendations: Home health PT Equipment Recommended: None recommended by PT PT Goals  Acute Rehab PT Goals PT Goal: Supine/Side to Sit - Progress: Progressing toward goal PT Goal: Sit to Supine/Side - Progress: Other (comment) (Not addressed today. ) PT Goal: Sit to Stand - Progress: Progressing toward goal PT Goal: Stand to Sit - Progress: Progressing toward goal PT Transfer Goal: Bed to Chair/Chair to Bed - Progress: Other (comment) (Not addressed today. ) PT Goal: Ambulate - Progress: Progressing toward goal PT Goal: Perform Home Exercise Program - Progress: Progressing toward goal  PT Treatment Precautions/Restrictions  Precautions Precautions: Fall Required Braces or Orthoses: No Restrictions Weight Bearing Restrictions: No Other Position/Activity Restrictions: BKA positioning Mobility (including Balance) Bed Mobility Supine to Sit: 7: Independent;HOB elevated (Comment degrees);With rails (25 degrees) Sitting - Scoot to Edge of Bed: 7: Independent Transfers Transfers: Yes Sit to Stand: From bed;From chair/3-in-1;4: Min assist;With upper extremity assist Sit to Stand Details (indicate cue type and reason): Verbal cues for safe hand placement.  Stand to Sit: 4: Min assist;To chair/3-in-1;With armrests Stand to Sit Details: Min assist for safety as patient plopped into  chair despite verbal cueing to slow descent with upper extremities.  Pt. did sit safely to 3-in-1 to use bathroom.  Explained dangers of plopping to patient and explained proper technique. Ambulation/Gait Ambulation/Gait: Yes Ambulation/Gait Assistance: 4: Min assist Ambulation/Gait Assistance Details (indicate cue type and reason): Verbal cues for speed and proper use of RW and for RLE positioning.  Ambulation Distance (Feet): 40 Feet (20 feet to bathroom, 15 feet to sink, 5 feet to chair) Assistive device: Rolling walker Gait Pattern: Step-to pattern (NWB on RLE) Stairs: No    Exercise  Amputee Exercises Quad Sets: AROM;Right;15 reps;Seated Hip Extension: AROM;Right;15 reps;Seated Hip ABduction/ADduction: AROM;Right;15 reps;Seated Straight Leg Raises: AROM;Both;15 reps;Seated End of Session PT - End of Session Equipment Utilized During Treatment: Gait belt Activity Tolerance: Patient tolerated treatment well Patient left: in chair;with call bell in reach Nurse Communication: Mobility status for transfers;Mobility status for ambulation General Behavior During Session: [facility] for tasks performed Cognition: [facility] for tasks performed  Laney Pastor, SPT  02/07/2011, 1:29 PM

## 2011-02-08 LAB — COMPREHENSIVE METABOLIC PANEL
ALT: 5 U/L (ref 0–35)
Albumin: 2 g/dL — ABNORMAL LOW (ref 3.5–5.2)
Calcium: 9.4 mg/dL (ref 8.4–10.5)
GFR calc Af Amer: 74 mL/min — ABNORMAL LOW (ref >90)
Glucose, Bld: 69 mg/dL — ABNORMAL LOW (ref 70–99)
Potassium: 4.1 mEq/L (ref 3.5–5.1)
Sodium: 137 mEq/L (ref 135–145)
Total Protein: 6.5 g/dL (ref 6.0–8.3)

## 2011-02-08 LAB — GLUCOSE, CAPILLARY: Glucose-Capillary: 61 mg/dL — ABNORMAL LOW (ref 70–99)

## 2011-02-08 LAB — PROTIME-INR
INR: 2.09 — ABNORMAL HIGH (ref 0.00–1.49)
Prothrombin Time: 23.8 s — ABNORMAL HIGH (ref 11.6–15.2)

## 2011-02-08 MED ORDER — HYDROCODONE-ACETAMINOPHEN 5-500 MG PO TABS: 1.0000 | ORAL_TABLET | Freq: Four times a day (QID) | ORAL | Status: AC | PRN

## 2011-02-08 NOTE — Progress Notes (Signed)
Physical Therapy Treatment Patient Details Name: Raven Watson MRN: [id number] DOB: [birth year] Today's Date: 02/08/2011  PT Assessment/Plan  PT - Assessment/Plan Comments on Treatment Session: Pt will not be able to receive HHPT secondary to lack of payment source and spent increased time educating patient for all amputation needs. Education included sensation, wrapping, and HEP education with handouts provided for each of these. Pt also educated for amputee board for [facility] and need for OPPT followup to prepare and plan for any potential prosthesis. Pt verbalized understanding of all education and again reinforced need for slow controlled mobility and lack of impulsivity to decrease fall risk and associated complications.  PT Plan: Discharge plan needs to be updated Follow Up Recommendations: Outpatient PT Equipment Recommended: Other (comment) (amputee board for [facility]) PT Goals  Acute Rehab PT Goals PT Goal: Supine/Side to Sit - Progress: Met PT Goal: Sit to Supine/Side - Progress: Met PT Goal: Sit to Stand - Progress: Met PT Goal: Stand to Sit - Progress: Met PT Transfer Goal: Bed to Chair/Chair to Bed - Progress: Other (comment) (not performed) PT Goal: Ambulate - Progress: Progressing toward goal PT Goal: Perform Home Exercise Program - Progress: Progressing toward goal  PT Treatment Precautions/Restrictions  Precautions Precautions: Fall Required Braces or Orthoses: No Restrictions Weight Bearing Restrictions: No Other Position/Activity Restrictions: BKA positioning Mobility (including Balance) Bed Mobility Supine to Sit: 7: Independent Sitting - Scoot to Edge of Bed: 7: Independent Sit to Supine - Right: 7: Independent Transfers Sit to Stand: 6: Modified independent (Device/Increase time);From bed Sit to Stand Details (indicate cue type and reason): Initial cueing before transfer for safety and to decrease impulsivity secondary to previous visit. Pt performed x 2 at bed Stand to  Sit: To bed;6: Modified independent (Device/Increase time) Ambulation/Gait Ambulation/Gait Assistance: 5: Supervision Ambulation/Gait Assistance Details (indicate cue type and reason): Cueing to look up, maintain RLE down during gait, roll rather than pickup RW Ambulation Distance (Feet): 50 Feet Assistive device: Rolling walker Gait Pattern:  (Hopping on LLE) Stairs: No Wheelchair Mobility Wheelchair Mobility: No    Exercise  General Exercises - Lower Extremity Hip ABduction/ADduction: AROM;5 reps;Right Other Exercises Other Exercises: Hip extension RLE in Left x5 A/ROM End of Session PT - End of Session Equipment Utilized During Treatment: Gait belt Activity Tolerance: Patient tolerated treatment well Patient left: in bed General Behavior During Session: [facility] for tasks performed Cognition: [facility] for tasks performed  Delorse Lek 02/08/2011, 11:44 AM  Toney Sang, PT [phone]

## 2011-02-08 NOTE — Progress Notes (Signed)
Late Entry. Patient referred to Child psychotherapist for SNF placement. She will return home with family/friend support. DME and home health is recommended per PT. No SW intervention is indicated.  SW signing off.  Per MD- patient will be d/cd home today.  Darylene Price, BSW, 02/08/2011 8:46 AM

## 2011-02-08 NOTE — Progress Notes (Signed)
Received a call from patient's sister-Sherba Boline, she said that patient will recover at her home in [place] so that she can receive physical therapy.Contacted [facility] and gave them the address, entered in [facility].

## 2011-02-08 NOTE — Discharge Summary (Signed)
Physician Discharge Summary  Patient ID: Raven Watson MRN: [id number] DOB/AGE: 1960-11-01 50 y.o.  Admit date: 02/05/2011 Discharge date: 02/08/2011  Admission Diagnoses: Osteomyelitis abscess gangrene right foot  Discharge Diagnoses: Same   Discharged Condition: stable  Hospital Course: Patient's hospital course was essentially unremarkable she underwent a transtibial amputation on the right on 1211. Postoperatively patient progressed well she was able to ambulate independently and was discharged to home in stable condition.  Consults: none  Significant Diagnostic Studies: labs: Routine labs obtained  Treatments: surgery: Right transtibial amputation  Discharge Exam: Blood pressure 108/69, pulse 89, temperature 98.5 F (36.9 C), temperature source Oral, resp. rate 18, height 5\' 8"  (1.727 m), weight 94.5 kg (208 lb 5.4 oz), SpO2 97.00%. Incision/Wound: incision and dressing dry at time of discharge  Disposition: Home or Self Care   Current Discharge Medication List    START taking these medications   Details  HYDROcodone-acetaminophen (VICODIN) 5-500 MG per tablet Take 1 tablet by mouth every 6 (six) hours as needed for pain. Qty: 60 tablet, Refills: 0      CONTINUE these medications which have NOT CHANGED   Details  aspirin 81 MG tablet Take 81 mg by mouth daily.      docusate sodium (COLACE) 100 MG capsule Take 100 mg by mouth 2 (two) times daily.      enalapril (VASOTEC) 10 MG tablet Take 10 mg by mouth daily.      gemfibrozil (LOPID) 600 MG tablet Take 600 mg by mouth 2 (two) times daily before a meal.      hydrochlorothiazide (MICROZIDE) 12.5 MG capsule Take 12.5 mg by mouth daily.      insulin aspart (NOVOLOG) 100 UNIT/ML injection Inject 5 Units into the skin 3 (three) times daily before meals. Sliding scale    insulin glargine (LANTUS) 100 UNIT/ML injection Inject 35 Units into the skin at bedtime.     lovastatin (MEVACOR) 40 MG tablet Take 80 mg by mouth  at bedtime.         Follow-up Information    Follow up with Nadara Mustard, MD.   Contact information:   [address] [place] [address] [phone]          Signed: Nadara Mustard 02/08/2011, 6:37 AM

## 2011-02-08 NOTE — Progress Notes (Signed)
CARE MANAGEMENT NOTE 02/08/2011 Patient has no health insurance, lives in [place], [place] and will not be able to receive services thru [facility]. Spoke with patient's nurse, states patient is able to use walker well and has a walker at home. Attempted to speak with patient,she was on the phone and couldnt speak with me. I informed the nurse of the reason for my visit.

## 2012-11-30 ENCOUNTER — Ambulatory Visit: Payer: Self-pay

## 2013-06-16 DIAGNOSIS — E785 Hyperlipidemia, unspecified: Secondary | ICD-10-CM | POA: Insufficient documentation

## 2013-12-02 ENCOUNTER — Ambulatory Visit: Payer: Self-pay | Admitting: Family Medicine

## 2014-01-24 ENCOUNTER — Ambulatory Visit: Payer: BC Managed Care – PPO

## 2014-03-22 DIAGNOSIS — I5042 Chronic combined systolic (congestive) and diastolic (congestive) heart failure: Secondary | ICD-10-CM | POA: Insufficient documentation

## 2015-02-06 ENCOUNTER — Other Ambulatory Visit: Payer: Self-pay | Admitting: Chiropractic Medicine

## 2015-02-06 DIAGNOSIS — Z1231 Encounter for screening mammogram for malignant neoplasm of breast: Secondary | ICD-10-CM

## 2015-02-21 ENCOUNTER — Ambulatory Visit
Admission: RE | Admit: 2015-02-21 | Discharge: 2015-02-21 | Disposition: A | Discharge status: Home / Self Care | Payer: Medicaid Other | Source: Ambulatory Visit | Attending: Chiropractic Medicine | Admitting: Chiropractic Medicine

## 2015-02-21 DIAGNOSIS — Z1231 Encounter for screening mammogram for malignant neoplasm of breast: Secondary | ICD-10-CM | POA: Diagnosis not present

## 2015-07-12 ENCOUNTER — Other Ambulatory Visit: Payer: Self-pay | Admitting: Physician Assistant

## 2015-07-12 DIAGNOSIS — Z1231 Encounter for screening mammogram for malignant neoplasm of breast: Secondary | ICD-10-CM

## 2016-05-02 ENCOUNTER — Ambulatory Visit
Admission: RE | Admit: 2016-05-02 | Discharge: 2016-05-02 | Disposition: A | Discharge status: Home / Self Care | Payer: Medicare Other | Source: Ambulatory Visit | Attending: Physician Assistant | Admitting: Physician Assistant

## 2016-05-02 DIAGNOSIS — Z1231 Encounter for screening mammogram for malignant neoplasm of breast: Secondary | ICD-10-CM | POA: Insufficient documentation

## 2016-08-29 ENCOUNTER — Encounter: Payer: Medicare Other | Attending: Family Medicine | Admitting: *Deleted

## 2016-08-29 VITALS — Ht 68.0 in | Wt 244.4 lb

## 2016-08-29 DIAGNOSIS — Z87891 Personal history of nicotine dependence: Secondary | ICD-10-CM | POA: Insufficient documentation

## 2016-08-29 DIAGNOSIS — E7800 Pure hypercholesterolemia, unspecified: Secondary | ICD-10-CM | POA: Insufficient documentation

## 2016-08-29 DIAGNOSIS — I110 Hypertensive heart disease with heart failure: Secondary | ICD-10-CM | POA: Insufficient documentation

## 2016-08-29 DIAGNOSIS — E11610 Type 2 diabetes mellitus with diabetic neuropathic arthropathy: Secondary | ICD-10-CM | POA: Insufficient documentation

## 2016-08-29 DIAGNOSIS — I5022 Chronic systolic (congestive) heart failure: Secondary | ICD-10-CM | POA: Insufficient documentation

## 2016-08-29 NOTE — Progress Notes (Signed)
Daily Session Note  Patient Details  Name: Raven Watson MRN: [id number] Date of Birth: [birth year] Referring Provider:     Cardiac Rehab from 08/29/2016 in [facility]  Referring Provider  [name]      Encounter Date: 08/29/2016  Check In:     Session Check In - 08/29/16 1414      Check-In   Location [facility]-Cardiac & Pulmonary Rehab   Staff Present Heath Lark, RN, BSN, CCRP;Mayline Dragon Sherryll Burger, RN Vickki Hearing, BA, ACSM CEP, Exercise Physiologist   Supervising physician immediately available to respond to emergencies See telemetry face sheet for immediately available ER MD   Medication changes reported     No   Fall or balance concerns reported    No   Warm-up and Cool-down Performed as group-led instruction   Resistance Training Performed Yes   VAD Patient? No     Pain Assessment   Currently in Pain? No/denies           Exercise Prescription Changes - 08/29/16 1400      Response to Exercise   Blood Pressure (Admit) 132/88   Blood Pressure (Exercise) 160/78   Blood Pressure (Exit) 126/82   Heart Rate (Admit) 88 bpm   Heart Rate (Exercise) 78 bpm   Heart Rate (Exit) 82 bpm   Oxygen Saturation (Admit) 100 %   Oxygen Saturation (Exercise) 99 %   Rating of Perceived Exertion (Exercise) 13      History  Smoking Status  . Former Smoker  . Packs/day: 1.50  . Years: 25.00  . Types: Cigarettes  Smokeless Tobacco  . Never Used    Comment: "quit smoking cigarettes ~ 2009"    Goals Met:  Proper associated with RPD/PD & O2 Sat Exercise tolerated well No report of cardiac concerns or symptoms Strength training completed today  Goals Unmet:  Not Applicable  Comments: Med Review Completed    Dr. Emily Filbert is Medical Director for [place] and [facility].

## 2016-08-29 NOTE — Patient Instructions (Signed)
Patient Instructions  Patient Details  Name: Raven Watson MRN: [id number] Date of Birth: 02/25/1961 Referring Provider:  Vernard Gambleseen, Cody S, MD  Below are the personal goals you chose as well as exercise and nutrition goals. Our goal is to help you keep on track towards obtaining and maintaining your goals. We will be discussing your progress on these goals with you throughout the program.  Initial Exercise Prescription:     Initial Exercise Prescription - 08/29/16 1400      Date of Initial Exercise RX and Referring Provider   Date 08/29/16   Referring Provider Julio Almeen     Treadmill   MPH 1.8   Grade 0.5   Minutes 15  3/3/3   METs 2.5     Recumbant Bike   Level 1   RPM 50   Watts 20   Minutes 15   METs 2.5     NuStep   Level 2   SPM 80   Minutes 15   METs 2.5     Biostep-RELP   Level 2   SPM 50   Minutes 15   METs 2.5     Track   Minutes 15     Prescription Details   Frequency (times per week) 3   Duration Progress to 30 minutes of continuous aerobic without signs/symptoms of physical distress     Intensity   THRR 40-80% of Max Heartrate 116-150   Ratings of Perceived Exertion 11-13     Resistance Training   Training Prescription Yes   Weight 2   Reps 10-15      Exercise Goals: Frequency: Be able to perform aerobic exercise three times per week working toward 3-5 days per week.  Intensity: Work with a perceived exertion of 11 (fairly light) - 15 (hard) as tolerated. Follow your new exercise prescription and watch for changes in prescription as you progress with the program. Changes will be reviewed with you when they are made.  Duration: You should be able to do 30 minutes of continuous aerobic exercise in addition to a 5 minute warm-up and a 5 minute cool-down routine.  Nutrition Goals: Your personal nutrition goals will be established when you do your nutrition analysis with the dietician.  The following are nutrition guidelines to follow: Cholesterol <  200mg /day Sodium < 1500mg /day Fiber: Women over 50 yrs - 21 grams per day  Personal Goals:     Personal Goals and Risk Factors at Admission - 08/29/16 1422      Core Components/Risk Factors/Patient Goals on Admission    Weight Management Obesity;Yes   Intervention Weight Management: Develop a combined nutrition and exercise program designed to reach desired caloric intake, while maintaining appropriate intake of nutrient and fiber, sodium and fats, and appropriate energy expenditure required for the weight goal.   Admit Weight 244 lb (110.7 kg)   Goal Weight: Short Term 240 lb (108.9 kg)   Goal Weight: Long Term 150 lb (68 kg)   Expected Outcomes Short Term: Continue to assess and modify interventions until short term weight is achieved;Long Term: Adherence to nutrition and physical activity/exercise program aimed toward attainment of established weight goal;Weight Loss: Understanding of general recommendations for a balanced deficit meal plan, which promotes 1-2 lb weight loss per week and includes a negative energy balance of [phone] kcal/d   Diabetes Yes   Intervention Provide education about signs/symptoms and action to take for hypo/hyperglycemia.;Provide education about proper nutrition, including hydration, and aerobic/resistive exercise prescription along with prescribed medications to  achieve blood glucose in normal ranges: Fasting glucose 65-99 mg/dL   Expected Outcomes Short Term: Participant verbalizes understanding of the signs/symptoms and immediate care of hyper/hypoglycemia, proper foot care and importance of medication, aerobic/resistive exercise and nutrition plan for blood glucose control.;Long Term: Attainment of HbA1C < 7%.   Heart Failure Yes   Intervention Provide a combined exercise and nutrition program that is supplemented with education, support and counseling about heart failure. Directed toward relieving symptoms such as shortness of breath, decreased exercise  tolerance, and extremity edema.   Expected Outcomes Improve functional capacity of life   Hypertension Yes   Intervention Provide education on lifestyle modifcations including regular physical activity/exercise, weight management, moderate sodium restriction and increased consumption of fresh fruit, vegetables, and low fat dairy, alcohol moderation, and smoking cessation.;Monitor prescription use compliance.   Expected Outcomes Short Term: Continued assessment and intervention until BP is < 140/74mm HG in hypertensive participants. < 130/56mm HG in hypertensive participants with diabetes, heart failure or chronic kidney disease.;Long Term: Maintenance of blood pressure at goal levels.   Lipids Yes   Intervention Provide education and support for participant on nutrition & aerobic/resistive exercise along with prescribed medications to achieve LDL 70mg , HDL >40mg .   Expected Outcomes Short Term: Participant states understanding of desired cholesterol values and is compliant with medications prescribed. Participant is following exercise prescription and nutrition guidelines.;Long Term: Cholesterol controlled with medications as prescribed, with individualized exercise RX and with personalized nutrition plan. Value goals: LDL < 70mg , HDL > 40 mg.   Stress Yes  Unable to perform yard work and household activities   Intervention Offer individual and/or small group education and counseling on adjustment to heart disease, stress management and health-related lifestyle change. Teach and support self-help strategies.;Refer participants experiencing significant psychosocial distress to appropriate mental health specialists for further evaluation and treatment. When possible, include family members and significant others in education/counseling sessions.   Expected Outcomes Short Term: Participant demonstrates changes in health-related behavior, relaxation and other stress management skills, ability to obtain  effective social support, and compliance with psychotropic medications if prescribed.;Long Term: Emotional wellbeing is indicated by absence of clinically significant psychosocial distress or social isolation.      Tobacco Use Initial Evaluation: History  Smoking Status  . Former Smoker  . Packs/day: 1.50  . Years: 25.00  . Types: Cigarettes  Smokeless Tobacco  . Never Used    Comment: "quit smoking cigarettes ~ 2009"    Copy of goals given to participant.

## 2016-08-29 NOTE — Progress Notes (Signed)
Cardiac Individual Treatment Plan  Patient Details  Name: Raven Watson MRN: [id number] Date of Birth: 1960/11/08 Referring Provider:     Cardiac Rehab from 08/29/2016 in [facility] Cardiac and Pulmonary Rehab  Referring Provider  [name]      Initial Encounter Date:    Cardiac Rehab from 08/29/2016 in [facility] Cardiac and Pulmonary Rehab  Date  08/29/16  Referring Provider  Julio Alm      Visit Diagnosis: Heart failure, chronic systolic (HCC)  Patient's Home Medications on Admission:  Current Outpatient Prescriptions:  .  aspirin 81 MG tablet, Take 81 mg by mouth daily.  , Disp: , Rfl:  .  docusate sodium (COLACE) 100 MG capsule, Take 100 mg by mouth 2 (two) times daily.  , Disp: , Rfl:  .  enalapril (VASOTEC) 10 MG tablet, Take 10 mg by mouth daily.  , Disp: , Rfl:  .  gabapentin (NEURONTIN) 300 MG capsule, Take 300 mg by mouth., Disp: , Rfl:  .  gemfibrozil (LOPID) 600 MG tablet, Take 600 mg by mouth 2 (two) times daily before a meal.  , Disp: , Rfl:  .  hydrochlorothiazide (MICROZIDE) 12.5 MG capsule, Take 12.5 mg by mouth daily.  , Disp: , Rfl:  .  insulin aspart (NOVOLOG) 100 UNIT/ML injection, Inject 5 Units into the skin 3 (three) times daily before meals. Sliding scale, Disp: , Rfl:  .  insulin glargine (LANTUS) 100 UNIT/ML injection, Inject 35 Units into the skin at bedtime. , Disp: , Rfl:  .  lovastatin (MEVACOR) 40 MG tablet, Take 80 mg by mouth at bedtime.  , Disp: , Rfl:   Past Medical History: Past Medical History:  Diagnosis Date  . Anemia 08/30/2010   microcytic anemia  . Blood transfusion 08/2010  . Diabetes mellitus   . Headache(784.0)   . Hypercholesteremia   . Hypertension   . 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 Former Smoker  . Packs/day: 1.50  .  Years: 25.00  . Types: Cigarettes  Smokeless Tobacco  . Never Used    Comment: "quit smoking cigarettes ~ 2009"    Labs: Recent Review Flowsheet Data    Labs for ITP Cardiac and Pulmonary Rehab Latest Ref Rng & Units 08/26/2010 02/05/2011   Hemoglobin A1c <5.7 % 12.6(H) 9.0(H)       Exercise Target Goals: Date: 08/29/16  Exercise Program Goal: Individual exercise prescription set with THRR, safety & activity barriers. Participant demonstrates ability to understand and report RPE using BORG scale, to self-measure pulse accurately, and to acknowledge the importance of the exercise prescription.  Exercise Prescription Goal: Starting with aerobic activity 30 plus minutes a day, 3 days per week for initial exercise prescription. Provide home exercise prescription and guidelines that participant acknowledges understanding prior to discharge.  Activity Barriers & Risk Stratification:   6 Minute Walk:     6 Minute Walk    Row Name 08/29/16 1409         6 Minute Walk   Phase Initial     Distance 800 feet     Walk Time 5 minutes     # of Rest Breaks 1  stopped at 5 min     MPH 1.8     METS 2.55     RPE 13  VO2 Peak 8.93     Symptoms Yes (comment)     Comments back pain 10/10     Resting HR 82 bpm     Resting BP 132/88     Max Ex. HR 95 bpm     Max Ex. BP 160/78     2 Minute Post BP 126/82        Oxygen Initial Assessment:   Oxygen Re-Evaluation:   Oxygen Discharge (Final Oxygen Re-Evaluation):   Initial Exercise Prescription:     Initial Exercise Prescription - 08/29/16 1400      Date of Initial Exercise RX and Referring Provider   Date 08/29/16   Referring Provider Julio Alm     Treadmill   MPH 1.8   Grade 0.5   Minutes 15  3/3/3   METs 2.5     Recumbant Bike   Level 1   RPM 50   Watts 20   Minutes 15   METs 2.5     NuStep   Level 2   SPM 80   Minutes 15   METs 2.5     Biostep-RELP   Level 2   SPM 50   Minutes 15   METs 2.5      Track   Minutes 15     Prescription Details   Frequency (times per week) 3   Duration Progress to 30 minutes of continuous aerobic without signs/symptoms of physical distress     Intensity   THRR 40-80% of Max Heartrate 116-150   Ratings of Perceived Exertion 11-13     Resistance Training   Training Prescription Yes   Weight 2   Reps 10-15      Perform Capillary Blood Glucose checks as needed.  Exercise Prescription Changes:     Exercise Prescription Changes    Row Name 08/29/16 1400             Response to Exercise   Blood Pressure (Admit) 132/88       Blood Pressure (Exercise) 160/78       Blood Pressure (Exit) 126/82       Heart Rate (Admit) 88 bpm       Heart Rate (Exercise) 78 bpm       Heart Rate (Exit) 82 bpm       Oxygen Saturation (Admit) 100 %       Oxygen Saturation (Exercise) 99 %       Rating of Perceived Exertion (Exercise) 13          Exercise Comments:   Exercise Goals and Review:     Exercise Goals    Row Name 08/29/16 1411             Exercise Goals   Increase Physical Activity Yes       Intervention Provide advice, education, support and counseling about physical activity/exercise needs.;Develop an individualized exercise prescription for aerobic and resistive training based on initial evaluation findings, risk stratification, comorbidities and participant's personal goals.       Expected Outcomes Achievement of increased cardiorespiratory fitness and enhanced flexibility, muscular endurance and strength shown through measurements of functional capacity and personal statement of participant.       Increase Strength and Stamina Yes       Intervention Provide advice, education, support and counseling about physical activity/exercise needs.;Develop an individualized exercise prescription for aerobic and resistive training based on initial evaluation findings, risk stratification, comorbidities and participant's personal goals.        Expected Outcomes  Achievement of increased cardiorespiratory fitness and enhanced flexibility, muscular endurance and strength shown through measurements of functional capacity and personal statement of participant.          Exercise Goals Re-Evaluation :   Discharge Exercise Prescription (Final Exercise Prescription Changes):     Exercise Prescription Changes - 08/29/16 1400      Response to Exercise   Blood Pressure (Admit) 132/88   Blood Pressure (Exercise) 160/78   Blood Pressure (Exit) 126/82   Heart Rate (Admit) 88 bpm   Heart Rate (Exercise) 78 bpm   Heart Rate (Exit) 82 bpm   Oxygen Saturation (Admit) 100 %   Oxygen Saturation (Exercise) 99 %   Rating of Perceived Exertion (Exercise) 13      Nutrition:  Target Goals: Understanding of nutrition guidelines, daily intake of sodium 1500mg , cholesterol 200mg , calories 30% from fat and 7% or less from saturated fats, daily to have 5 or more servings of fruits and vegetables.  Biometrics:     Pre Biometrics - 08/29/16 1408      Pre Biometrics   Height 5\' 8"  (1.727 m)   Weight 244 lb 6.4 oz (110.9 kg)   Waist Circumference 46 inches   Hip Circumference 48.5 inches   Waist to Hip Ratio 0.95 %   BMI (Calculated) 37.2       Nutrition Therapy Plan and Nutrition Goals:   Nutrition Discharge: Rate Your Plate Scores:     Nutrition Assessments - 08/29/16 1425      MEDFICTS Scores   Pre Score 41      Nutrition Goals Re-Evaluation:   Nutrition Goals Discharge (Final Nutrition Goals Re-Evaluation):   Psychosocial: Target Goals: Acknowledge presence or absence of significant depression and/or stress, maximize coping skills, provide positive support system. Participant is able to verbalize types and ability to use techniques and skills needed for reducing stress and depression.   Initial Review & Psychosocial Screening:     Initial Psych Review & Screening - 08/29/16 1426      Initial Review   Current  issues with Current Sleep Concerns  wakes up hourly to catch breath, but improving     Family Dynamics   Good Support System? Yes  Significant other, Sister and family     Barriers   Psychosocial barriers to participate in program There are no identifiable barriers or psychosocial needs.     Screening Interventions   Interventions Encouraged to exercise;Program counselor consult      Quality of Life Scores:      Quality of Life - 08/29/16 1430      Quality of Life Scores   Health/Function Pre 21 %   Socioeconomic Pre 21 %   Psych/Spiritual Pre 21 %   Family Pre 21 %   GLOBAL Pre 21 %      PHQ-9: Recent Review Flowsheet Data    Depression screen [facility] 2/9 08/29/2016 11/14/2010 10/09/2010   Decreased Interest 0 0 0   Down, Depressed, Hopeless 1 0 0   PHQ - 2 Score 1 0 0   Altered sleeping 1 - -   Tired, decreased energy 3 - -   Change in appetite 1 - -   Feeling bad or failure about yourself  1 - -   Trouble concentrating 0 - -   Moving slowly or fidgety/restless 0 - -   Suicidal thoughts 0 - -   PHQ-9 Score 7 - -   Difficult doing work/chores Somewhat difficult - -  Interpretation of Total Score  Total Score Depression Severity:  1-4 = Minimal depression, 5-9 = Mild depression, 10-14 = Moderate depression, 15-19 = Moderately severe depression, 20-27 = Severe depression   Psychosocial Evaluation and Intervention:   Psychosocial Re-Evaluation:   Psychosocial Discharge (Final Psychosocial Re-Evaluation):   Vocational Rehabilitation: Provide vocational rehab assistance to qualifying candidates.   Vocational Rehab Evaluation & Intervention:     Vocational Rehab - 08/29/16 1433      Initial Vocational Rehab Evaluation & Intervention   Assessment shows need for Vocational Rehabilitation No      Education: Education Goals: Education classes will be provided on a weekly basis, covering required topics. Participant will state understanding/return  demonstration of topics presented.  Learning Barriers/Preferences:     Learning Barriers/Preferences - 08/29/16 1430      Learning Barriers/Preferences   Learning Barriers Sight;Hearing   Learning Preferences Individual Instruction      Education Topics: General Nutrition Guidelines/Fats and Fiber: -Group instruction provided by verbal, written material, models and posters to present the general guidelines for heart healthy nutrition. Gives an explanation and review of dietary fats and fiber.   Controlling Sodium/Reading Food Labels: -Group verbal and written material supporting the discussion of sodium use in heart healthy nutrition. Review and explanation with models, verbal and written materials for utilization of the food label.   Exercise Physiology & Risk Factors: - Group verbal and written instruction with models to review the exercise physiology of the cardiovascular system and associated critical values. Details cardiovascular disease risk factors and the goals associated with each risk factor.   Aerobic Exercise & Resistance Training: - Gives group verbal and written discussion on the health impact of inactivity. On the components of aerobic and resistive training programs and the benefits of this training and how to safely progress through these programs.   Flexibility, Balance, General Exercise Guidelines: - Provides group verbal and written instruction on the benefits of flexibility and balance training programs. Provides general exercise guidelines with specific guidelines to those with heart or lung disease. Demonstration and skill practice provided.   Stress Management: - Provides group verbal and written instruction about the health risks of elevated stress, cause of high stress, and healthy ways to reduce stress.   Depression: - Provides group verbal and written instruction on the correlation between heart/lung disease and depressed mood, treatment options,  and the stigmas associated with seeking treatment.   Anatomy & Physiology of the Heart: - Group verbal and written instruction and models provide basic cardiac anatomy and physiology, with the coronary electrical and arterial systems. Review of: AMI, Angina, Valve disease, Heart Failure, Cardiac Arrhythmia, Pacemakers, and the ICD.   Cardiac Procedures: - Group verbal and written instruction and models to describe the testing methods done to diagnose heart disease. Reviews the outcomes of the test results. Describes the treatment choices: Medical Management, Angioplasty, or Coronary Bypass Surgery.   Cardiac Medications: - Group verbal and written instruction to review commonly prescribed medications for heart disease. Reviews the medication, class of the drug, and side effects. Includes the steps to properly store meds and maintain the prescription regimen.   Go Sex-Intimacy & Heart Disease, Get SMART - Goal Setting: - Group verbal and written instruction through game format to discuss heart disease and the return to sexual intimacy. Provides group verbal and written material to discuss and apply goal setting through the application of the S.M.A.R.T. Method.   Other Matters of the Heart: - Provides group verbal, written materials  and models to describe Heart Failure, Angina, Valve Disease, and Diabetes in the realm of heart disease. Includes description of the disease process and treatment options available to the cardiac patient.   Exercise & Equipment Safety: - Individual verbal instruction and demonstration of equipment use and safety with use of the equipment.   [facility]  Date  08/29/16  Educator  SB  Instruction Review Code  2- meets goals/outcomes      Infection Prevention: - Provides verbal and written material to individual with discussion of infection control including proper hand washing and proper equipment cleaning  during exercise session.   [facility]  Date  08/29/16  Educator  SB  Instruction Review Code  2- meets goals/outcomes      Falls Prevention: - Provides verbal and written material to individual with discussion of falls prevention and safety.   [facility]  Date  08/29/16  Educator  SB  Instruction Review Code  2- meets goals/outcomes      Diabetes: - Individual verbal and written instruction to review signs/symptoms of diabetes, desired ranges of glucose level fasting, after meals and with exercise. Advice that pre and post exercise glucose checks will be done for 3 sessions at entry of program.   [facility]  Date  08/29/16  Educator  SB  Instruction Review Code  2- meets goals/outcomes       Knowledge Questionnaire Score:     Knowledge Questionnaire Score - 08/29/16 1431      Knowledge Questionnaire Score   Pre Score 19/28  Correct answers reviewed with Bonita QuinLinda      Core Components/Risk Factors/Patient Goals at Admission:     Personal Goals and Risk Factors at Admission - 08/29/16 1422      Core Components/Risk Factors/Patient Goals on Admission    Weight Management Obesity;Yes   Intervention Weight Management: Develop a combined nutrition and exercise program designed to reach desired caloric intake, while maintaining appropriate intake of nutrient and fiber, sodium and fats, and appropriate energy expenditure required for the weight goal.   Admit Weight 244 lb (110.7 kg)   Goal Weight: Short Term 240 lb (108.9 kg)   Goal Weight: Long Term 150 lb (68 kg)   Expected Outcomes Short Term: Continue to assess and modify interventions until short term weight is achieved;Long Term: Adherence to nutrition and physical activity/exercise program aimed toward attainment of established weight goal;Weight Loss: Understanding  of general recommendations for a balanced deficit meal plan, which promotes 1-2 lb weight loss per week and includes a negative energy balance of [phone] kcal/d   Diabetes Yes   Intervention Provide education about signs/symptoms and action to take for hypo/hyperglycemia.;Provide education about proper nutrition, including hydration, and aerobic/resistive exercise prescription along with prescribed medications to achieve blood glucose in normal ranges: Fasting glucose 65-99 mg/dL   Expected Outcomes Short Term: Participant verbalizes understanding of the signs/symptoms and immediate care of hyper/hypoglycemia, proper foot care and importance of medication, aerobic/resistive exercise and nutrition plan for blood glucose control.;Long Term: Attainment of HbA1C < 7%.   Heart Failure Yes   Intervention Provide a combined exercise and nutrition program that is supplemented with education, support and counseling about heart failure. Directed toward relieving symptoms such as shortness of breath, decreased exercise tolerance, and extremity edema.   Expected Outcomes Improve  functional capacity of life   Hypertension Yes   Intervention Provide education on lifestyle modifcations including regular physical activity/exercise, weight management, moderate sodium restriction and increased consumption of fresh fruit, vegetables, and low fat dairy, alcohol moderation, and smoking cessation.;Monitor prescription use compliance.   Expected Outcomes Short Term: Continued assessment and intervention until BP is < 140/49mm HG in hypertensive participants. < 130/54mm HG in hypertensive participants with diabetes, heart failure or chronic kidney disease.;Long Term: Maintenance of blood pressure at goal levels.   Lipids Yes   Intervention Provide education and support for participant on nutrition & aerobic/resistive exercise along with prescribed medications to achieve LDL 70mg , HDL >40mg .   Expected Outcomes Short Term:  Participant states understanding of desired cholesterol values and is compliant with medications prescribed. Participant is following exercise prescription and nutrition guidelines.;Long Term: Cholesterol controlled with medications as prescribed, with individualized exercise RX and with personalized nutrition plan. Value goals: LDL < 70mg , HDL > 40 mg.   Stress Yes  Unable to perform yard work and household activities   Intervention Offer individual and/or small group education and counseling on adjustment to heart disease, stress management and health-related lifestyle change. Teach and support self-help strategies.;Refer participants experiencing significant psychosocial distress to appropriate mental health specialists for further evaluation and treatment. When possible, include family members and significant others in education/counseling sessions.   Expected Outcomes Short Term: Participant demonstrates changes in health-related behavior, relaxation and other stress management skills, ability to obtain effective social support, and compliance with psychotropic medications if prescribed.;Long Term: Emotional wellbeing is indicated by absence of clinically significant psychosocial distress or social isolation.      Core Components/Risk Factors/Patient Goals Review:    Core Components/Risk Factors/Patient Goals at Discharge (Final Review):    ITP Comments:     ITP Comments    Row Name 08/29/16 1415           ITP Comments Med Review completed, Initial ITP created. Diagnosis can be found in Care Everywhere 5/31          Comments: Med Review completed.

## 2016-09-02 ENCOUNTER — Encounter: Payer: Medicare Other | Admitting: *Deleted

## 2016-09-02 DIAGNOSIS — Z87891 Personal history of nicotine dependence: Secondary | ICD-10-CM | POA: Diagnosis not present

## 2016-09-02 DIAGNOSIS — I5022 Chronic systolic (congestive) heart failure: Secondary | ICD-10-CM

## 2016-09-02 DIAGNOSIS — E11610 Type 2 diabetes mellitus with diabetic neuropathic arthropathy: Secondary | ICD-10-CM | POA: Diagnosis not present

## 2016-09-02 DIAGNOSIS — E7800 Pure hypercholesterolemia, unspecified: Secondary | ICD-10-CM | POA: Diagnosis not present

## 2016-09-02 DIAGNOSIS — I110 Hypertensive heart disease with heart failure: Secondary | ICD-10-CM | POA: Diagnosis not present

## 2016-09-02 LAB — GLUCOSE, CAPILLARY: GLUCOSE-CAPILLARY: 96 mg/dL (ref 65–99)

## 2016-09-02 NOTE — Progress Notes (Signed)
Daily Session Note  Patient Details  Name: Raven Watson MRN: [id number] Date of Birth: 07/23/1960 Referring Provider:     Cardiac Rehab from 08/29/2016 in [facility] Cardiac and Pulmonary Rehab  Referring Provider  [name]      Encounter Date: 09/02/2016  Check In:     Session Check In - 09/02/16 1708      Check-In   Location [facility]-Cardiac & Pulmonary Rehab   Staff Present Nyoka Cowden, RN, BSN, MA;Haelyn Forgey Sherryll Burger, RN Moises Blood, BS, ACSM CEP, Exercise Physiologist;Joseph Flavia Shipper   Supervising physician immediately available to respond to emergencies See telemetry face sheet for immediately available ER MD   Medication changes reported     No   Fall or balance concerns reported    No   Warm-up and Cool-down Performed on first and last piece of equipment   Resistance Training Performed Yes   VAD Patient? No     Pain Assessment   Currently in Pain? No/denies         History  Smoking Status  . Former Smoker  . Packs/day: 1.50  . Years: 25.00  . Types: Cigarettes  Smokeless Tobacco  . Never Used    Comment: "quit smoking cigarettes ~ 2009"    Goals Met:  Proper associated with RPD/PD & O2 Sat Independence with exercise equipment Exercise tolerated well No report of cardiac concerns or symptoms Strength training completed today  Goals Unmet:  Not Applicable  Comments: First full day of exercise!  Patient was oriented to gym and equipment including functions, settings, policies, and procedures.  Patient's individual exercise prescription and treatment plan were reviewed.  All starting workloads were established based on the results of the 6 minute walk test done at initial orientation visit.  The plan for exercise progression was also introduced and progression will be customized based on patient's performance and goals.    Dr. Emily Filbert is Medical Director for [place] and [facility].

## 2016-09-04 ENCOUNTER — Encounter: Payer: Medicare Other | Admitting: *Deleted

## 2016-09-04 DIAGNOSIS — I110 Hypertensive heart disease with heart failure: Secondary | ICD-10-CM | POA: Diagnosis not present

## 2016-09-04 DIAGNOSIS — I5022 Chronic systolic (congestive) heart failure: Secondary | ICD-10-CM

## 2016-09-04 LAB — GLUCOSE, CAPILLARY
GLUCOSE-CAPILLARY: 142 mg/dL — AB (ref 65–99)
Glucose-Capillary: 87 mg/dL (ref 65–99)

## 2016-09-04 NOTE — Progress Notes (Signed)
Daily Session Note  Patient Details  Name: Raven Watson MRN: [id number] Date of Birth: 01/17/1961 Referring Provider:     Cardiac Rehab from 08/29/2016 in [facility] Cardiac and Pulmonary Rehab  Referring Provider  [name]      Encounter Date: 09/04/2016  Check In:     Session Check In - 09/04/16 1711      Check-In   Location [facility]-Cardiac & Pulmonary Rehab   Staff Present Nyoka Cowden, RN, BSN, MA;Meredith Sherryll Burger, RN Vickki Hearing, BA, ACSM CEP, Exercise Physiologist   Supervising physician immediately available to respond to emergencies See telemetry face sheet for immediately available ER MD   Medication changes reported     No   Fall or balance concerns reported    No   Warm-up and Cool-down Performed on first and last piece of equipment   Resistance Training Performed Yes   VAD Patient? No     Pain Assessment   Currently in Pain? No/denies         History  Smoking Status  . Former Smoker  . Packs/day: 1.50  . Years: 25.00  . Types: Cigarettes  Smokeless Tobacco  . Never Used    Comment: "quit smoking cigarettes ~ 2009"    Goals Met:  Proper associated with RPD/PD & O2 Sat Independence with exercise equipment Exercise tolerated well No report of cardiac concerns or symptoms Strength training completed today  Goals Unmet:  Not Applicable  Comments: Pt able to follow exercise prescription today without complaint.  Will continue to monitor for progression.    Dr. Emily Filbert is Medical Director for [place] and [facility].

## 2016-09-05 DIAGNOSIS — I5022 Chronic systolic (congestive) heart failure: Secondary | ICD-10-CM

## 2016-09-05 DIAGNOSIS — I110 Hypertensive heart disease with heart failure: Secondary | ICD-10-CM | POA: Diagnosis not present

## 2016-09-05 LAB — GLUCOSE, CAPILLARY
Glucose-Capillary: 217 mg/dL — ABNORMAL HIGH (ref 65–99)
Glucose-Capillary: 185 mg/dL — ABNORMAL HIGH (ref 65–99)

## 2016-09-05 NOTE — Progress Notes (Signed)
Daily Session Note  Patient Details  Name: Raven Watson MRN: [id number] Date of Birth: 1960-09-08 Referring Provider:     Cardiac Rehab from 08/29/2016 in [facility] Cardiac and Pulmonary Rehab  Referring Provider  [name]      Encounter Date: 09/05/2016  Check In:     Session Check In - 09/05/16 1622      Check-In   Location [facility]-Cardiac & Pulmonary Rehab   Staff Present Nyoka Cowden, RN, BSN, Bonnita Hollow, BS, ACSM CEP, Exercise Physiologist;Lexey Fletes Flavia Shipper   Supervising physician immediately available to respond to emergencies See telemetry face sheet for immediately available ER MD   Medication changes reported     No   Fall or balance concerns reported    No   Warm-up and Cool-down Performed on first and last piece of equipment   Resistance Training Performed Yes   VAD Patient? No     Pain Assessment   Currently in Pain? No/denies   Multiple Pain Sites No         History  Smoking Status  . Former Smoker  . Packs/day: 1.50  . Years: 25.00  . Types: Cigarettes  Smokeless Tobacco  . Never Used    Comment: "quit smoking cigarettes ~ 2009"    Goals Met:  Independence with exercise equipment Exercise tolerated well No report of cardiac concerns or symptoms Strength training completed today  Goals Unmet:  Not Applicable  Comments: Pt able to follow exercise prescription today without complaint.  Will continue to monitor for progression.   Dr. Emily Filbert is Medical Director for [place] and [facility].

## 2016-09-09 ENCOUNTER — Encounter: Payer: Medicare Other | Admitting: *Deleted

## 2016-09-09 DIAGNOSIS — I110 Hypertensive heart disease with heart failure: Secondary | ICD-10-CM | POA: Diagnosis not present

## 2016-09-09 DIAGNOSIS — I5022 Chronic systolic (congestive) heart failure: Secondary | ICD-10-CM

## 2016-09-09 LAB — GLUCOSE, CAPILLARY
Glucose-Capillary: 229 mg/dL — ABNORMAL HIGH (ref 65–99)
Glucose-Capillary: 183 mg/dL — ABNORMAL HIGH (ref 65–99)

## 2016-09-09 NOTE — Progress Notes (Signed)
Daily Session Note  Patient Details  Name: Raven Watson MRN: [id number] Date of Birth: Sep 24, 1960 Referring Provider:     Cardiac Rehab from 08/29/2016 in [facility] Cardiac and Pulmonary Rehab  Referring Provider  [name]      Encounter Date: 09/09/2016  Check In:     Session Check In - 09/09/16 1748      Check-In   Location [facility]-Cardiac & Pulmonary Rehab   Staff Present Earlean Shawl, BS, ACSM CEP, Exercise Physiologist;Amanda Oletta Darter, BA, ACSM CEP, Exercise Physiologist;Meredith Sherryll Burger, RN BSN   Supervising physician immediately available to respond to emergencies See telemetry face sheet for immediately available ER MD   Medication changes reported     No   Fall or balance concerns reported    No   Warm-up and Cool-down Performed on first and last piece of equipment   Resistance Training Performed Yes   VAD Patient? No     Pain Assessment   Currently in Pain? No/denies   Multiple Pain Sites No         History  Smoking Status  . Former Smoker  . Packs/day: 1.50  . Years: 25.00  . Types: Cigarettes  Smokeless Tobacco  . Never Used    Comment: "quit smoking cigarettes ~ 2009"    Goals Met:  Independence with exercise equipment Exercise tolerated well No report of cardiac concerns or symptoms Strength training completed today  Goals Unmet:  Not Applicable  Comments: Pt able to follow exercise prescription today without complaint.  Will continue to monitor for progression.    Dr. Emily Filbert is Medical Director for [facility].

## 2016-09-11 ENCOUNTER — Encounter: Payer: Self-pay | Admitting: *Deleted

## 2016-09-11 ENCOUNTER — Encounter: Payer: Medicare Other | Admitting: *Deleted

## 2016-09-11 DIAGNOSIS — I5022 Chronic systolic (congestive) heart failure: Secondary | ICD-10-CM

## 2016-09-11 DIAGNOSIS — I110 Hypertensive heart disease with heart failure: Secondary | ICD-10-CM | POA: Diagnosis not present

## 2016-09-11 NOTE — Progress Notes (Signed)
Daily Session Note  Patient Details  Name: Raven Watson MRN: [id number] Date of Birth: 07/30/1960 Referring Provider:     Cardiac Rehab from 08/29/2016 in [facility] Cardiac and Pulmonary Rehab  Referring Provider  [name]      Encounter Date: 09/11/2016  Check In:     Session Check In - 09/11/16 1614      Check-In   Location [facility]-Cardiac & Pulmonary Rehab   Staff Present Nyoka Cowden, RN, BSN, MA;Maleni Seyer Sherryll Burger, RN Vickki Hearing, BA, ACSM CEP, Exercise Physiologist   Supervising physician immediately available to respond to emergencies See telemetry face sheet for immediately available ER MD   Medication changes reported     No   Fall or balance concerns reported    No   Warm-up and Cool-down Performed on first and last piece of equipment   Resistance Training Performed Yes   VAD Patient? No     Pain Assessment   Currently in Pain? No/denies         History  Smoking Status  . Former Smoker  . Packs/day: 1.50  . Years: 25.00  . Types: Cigarettes  Smokeless Tobacco  . Never Used    Comment: "quit smoking cigarettes ~ 2009"    Goals Met:  Proper associated with RPD/PD & O2 Sat Independence with exercise equipment Exercise tolerated well No report of cardiac concerns or symptoms Strength training completed today  Goals Unmet:  Not Applicable  Comments: Pt able to follow exercise prescription today without complaint.  Will continue to monitor for progression.    Dr. Emily Filbert is Medical Director for [facility].

## 2016-09-11 NOTE — Progress Notes (Signed)
Cardiac Individual Treatment Plan  Patient Details  Name: Raven Watson MRN: [id number] Date of Birth: [birth year] Referring Provider:     Cardiac Rehab from 08/29/2016 in [facility] Cardiac and Pulmonary Rehab  Referring Provider  [name]      Initial Encounter Date:    Cardiac Rehab from 08/29/2016 in [facility] Cardiac and Pulmonary Rehab  Date  08/29/16  Referring Provider  [name]      Visit Diagnosis: Heart failure, chronic systolic ([place])  Patient's Home Medications on Admission:  Current Outpatient Prescriptions:  .  aspirin 81 MG tablet, Take 81 mg by mouth daily.  , Disp: , Rfl:  .  docusate sodium (COLACE) 100 MG capsule, Take 100 mg by mouth 2 (two) times daily.  , Disp: , Rfl:  .  enalapril (VASOTEC) 10 MG tablet, Take 10 mg by mouth daily.  , Disp: , Rfl:  .  gabapentin (NEURONTIN) 300 MG capsule, Take 300 mg by mouth., Disp: , Rfl:  .  gemfibrozil (LOPID) 600 MG tablet, Take 600 mg by mouth 2 (two) times daily before a meal.  , Disp: , Rfl:  .  hydrochlorothiazide (MICROZIDE) 12.5 MG capsule, Take 12.5 mg by mouth daily.  , Disp: , Rfl:  .  insulin aspart (NOVOLOG) 100 UNIT/ML injection, Inject 5 Units into the skin 3 (three) times daily before meals. Sliding scale, Disp: , Rfl:  .  insulin glargine (LANTUS) 100 UNIT/ML injection, Inject 35 Units into the skin at bedtime. , Disp: , Rfl:  .  lovastatin (MEVACOR) 40 MG tablet, Take 80 mg by mouth at bedtime.  , Disp: , Rfl:   Past Medical History: Past Medical History:  Diagnosis Date  . Anemia 08/30/2010   microcytic anemia  . Blood transfusion 08/2010  . Diabetes mellitus   . Headache(784.0)   . Hypercholesteremia   . Hypertension   . 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 Former Smoker  . Packs/day: 1.50  .  Years: 25.00  . Types: Cigarettes  Smokeless Tobacco  . Never Used    Comment: "quit smoking cigarettes ~ 2009"    Labs: Recent Review Flowsheet Data    Labs for ITP Cardiac and Pulmonary Rehab Latest Ref Rng & Units 08/26/2010 02/05/2011   Hemoglobin A1c <5.7 % 12.6(H) 9.0(H)       Exercise Target Goals:    Exercise Program Goal: Individual exercise prescription set with THRR, safety & activity barriers. Participant demonstrates ability to understand and report RPE using BORG scale, to self-measure pulse accurately, and to acknowledge the importance of the exercise prescription.  Exercise Prescription Goal: Starting with aerobic activity 30 plus minutes a day, 3 days per week for initial exercise prescription. Provide home exercise prescription and guidelines that participant acknowledges understanding prior to discharge.  Activity Barriers & Risk Stratification:   6 Minute Walk:     6 Minute Walk    Row Name 08/29/16 1409         6 Minute Walk   Phase Initial     Distance 800 feet     Walk Time 5 minutes     # of Rest Breaks 1  stopped at 5 min     MPH 1.8     METS 2.55     RPE 13  VO2 Peak 8.93     Symptoms Yes (comment)     Comments back pain 10/10     Resting HR 82 bpm     Resting BP 132/88     Max Ex. HR 95 bpm     Max Ex. BP 160/78     2 Minute Post BP 126/82        Oxygen Initial Assessment:   Oxygen Re-Evaluation:   Oxygen Discharge (Final Oxygen Re-Evaluation):   Initial Exercise Prescription:     Initial Exercise Prescription - 08/29/16 1400      Date of Initial Exercise RX and Referring Provider   Date 08/29/16   Referring Provider [name]     Treadmill   MPH 1.8   Grade 0.5   Minutes 15  3/3/3   METs 2.5     Recumbant Bike   Level 1   RPM 50   Watts 20   Minutes 15   METs 2.5     NuStep   Level 2   SPM 80   Minutes 15   METs 2.5     Biostep-RELP   Level 2   SPM 50   Minutes 15   METs 2.5     Track   Minutes  15     Prescription Details   Frequency (times per week) 3   Duration Progress to 30 minutes of continuous aerobic without signs/symptoms of physical distress     Intensity   THRR 40-80% of Max Heartrate 116-150   Ratings of Perceived Exertion 11-13     Resistance Training   Training Prescription Yes   Weight 2   Reps 10-15      Perform Capillary Blood Glucose checks as needed.  Exercise Prescription Changes:     Exercise Prescription Changes    Row Name 08/29/16 1400 09/06/16 1000           Response to Exercise   Blood Pressure (Admit) 132/88 122/82      Blood Pressure (Exercise) 160/78 138/84      Blood Pressure (Exit) 126/82 110/72      Heart Rate (Admit) 88 bpm 99 bpm      Heart Rate (Exercise) 78 bpm 104 bpm      Heart Rate (Exit) 82 bpm 88 bpm      Oxygen Saturation (Admit) 100 %  -      Oxygen Saturation (Exercise) 99 %  -      Rating of Perceived Exertion (Exercise) 13 15      Symptoms  - none      Duration  - Continue with 45 min of aerobic exercise without signs/symptoms of physical distress.      Intensity  - THRR unchanged        Progression   Progression  - Continue to progress workloads to maintain intensity without signs/symptoms of physical distress.      Average METs  - 3        Resistance Training   Training Prescription  - Yes      Weight  - 2 lb      Reps  - 10-15        Interval Training   Interval Training  - No        Biostep-RELP   Level  - 2      Minutes  - 15      METs  - 3        Track   Minutes  - 15  20 laps         Exercise Comments:   Exercise Goals and Review:     Exercise Goals    Row Name 08/29/16 1411             Exercise Goals   Increase Physical Activity Yes       Intervention Provide advice, education, support and counseling about physical activity/exercise needs.;Develop an individualized exercise prescription for aerobic and resistive training based on initial evaluation findings, risk  stratification, comorbidities and participant's personal goals.       Expected Outcomes Achievement of increased cardiorespiratory fitness and enhanced flexibility, muscular endurance and strength shown through measurements of functional capacity and personal statement of participant.       Increase Strength and Stamina Yes       Intervention Provide advice, education, support and counseling about physical activity/exercise needs.;Develop an individualized exercise prescription for aerobic and resistive training based on initial evaluation findings, risk stratification, comorbidities and participant's personal goals.       Expected Outcomes Achievement of increased cardiorespiratory fitness and enhanced flexibility, muscular endurance and strength shown through measurements of functional capacity and personal statement of participant.          Exercise Goals Re-Evaluation :     Exercise Goals Re-Evaluation    Row Name 09/06/16 1042             Exercise Goal Re-Evaluation   Exercise Goals Review Increase Physical Activity;Increase Strenth and Stamina       Comments Sakai is walking the track instead of TM due to her back and prosthtetic leg.  She is tolerating exercise well.       Expected Outcomes Short - Nyasia will attend class regularly.  Long - Quanta will continue to increase her laps walked and resistance on other machines.          Discharge Exercise Prescription (Final Exercise Prescription Changes):     Exercise Prescription Changes - 09/06/16 1000      Response to Exercise   Blood Pressure (Admit) 122/82   Blood Pressure (Exercise) 138/84   Blood Pressure (Exit) 110/72   Heart Rate (Admit) 99 bpm   Heart Rate (Exercise) 104 bpm   Heart Rate (Exit) 88 bpm   Rating of Perceived Exertion (Exercise) 15   Symptoms none   Duration Continue with 45 min of aerobic exercise without signs/symptoms of physical distress.   Intensity THRR unchanged     Progression   Progression  Continue to progress workloads to maintain intensity without signs/symptoms of physical distress.   Average METs 3     Resistance Training   Training Prescription Yes   Weight 2 lb   Reps 10-15     Interval Training   Interval Training No     Biostep-RELP   Level 2   Minutes 15   METs 3     Track   Minutes 15  20 laps      Nutrition:  Target Goals: Understanding of nutrition guidelines, daily intake of sodium <1541m, cholesterol <2064m calories 30% from fat and 7% or less from saturated fats, daily to have 5 or more servings of fruits and vegetables.  Biometrics:     Pre Biometrics - 08/29/16 1408      Pre Biometrics   Height _0  (1.727 m)   Weight 244 lb 6.4 oz (110.9 kg)   Waist Circumference 46 inches   Hip Circumference 48.5 inches   Waist to Hip Ratio 0.95 %  BMI (Calculated) 37.2       Nutrition Therapy Plan and Nutrition Goals:   Nutrition Discharge: Rate Your Plate Scores:     Nutrition Assessments - 08/29/16 1425      MEDFICTS Scores   Pre Score 41      Nutrition Goals Re-Evaluation:   Nutrition Goals Discharge (Final Nutrition Goals Re-Evaluation):   Psychosocial: Target Goals: Acknowledge presence or absence of significant depression and/or stress, maximize coping skills, provide positive support system. Participant is able to verbalize types and ability to use techniques and skills needed for reducing stress and depression.   Initial Review & Psychosocial Screening:     Initial Psych Review & Screening - 08/29/16 1426      Initial Review   Current issues with Current Sleep Concerns  wakes up hourly to catch breath, but improving     Family Dynamics   Good Support System? Yes  Significant other, Sister and family     Barriers   Psychosocial barriers to participate in program There are no identifiable barriers or psychosocial needs.     Screening Interventions   Interventions Encouraged to exercise;Program counselor  consult      Quality of Life Scores:      Quality of Life - 08/29/16 1430      Quality of Life Scores   Health/Function Pre 21 %   Socioeconomic Pre 21 %   Psych/Spiritual Pre 21 %   Family Pre 21 %   GLOBAL Pre 21 %      PHQ-9: Recent Review Flowsheet Data    Depression screen [facility] 2/9 08/29/2016 11/14/2010 10/09/2010   Decreased Interest 0 0 0   Down, Depressed, Hopeless 1 0 0   PHQ - 2 Score 1 0 0   Altered sleeping 1 - -   Tired, decreased energy 3 - -   Change in appetite 1 - -   Feeling bad or failure about yourself  1 - -   Trouble concentrating 0 - -   Moving slowly or fidgety/restless 0 - -   Suicidal thoughts 0 - -   PHQ-9 Score 7 - -   Difficult doing work/chores Somewhat difficult - -     Interpretation of Total Score  Total Score Depression Severity:  1-4 = Minimal depression, 5-9 = Mild depression, 10-14 = Moderate depression, 15-19 = Moderately severe depression, 20-27 = Severe depression   Psychosocial Evaluation and Intervention:     Psychosocial Evaluation - 09/02/16 1654      Psychosocial Evaluation & Interventions   Interventions Encouraged to exercise with the program and follow exercise prescription   Comments Counselor met with Ms. Lucita Lora) for initial psychosocial evaluation.  She turned 56 years old yesterday.  Micca has a strong support system as she lives with her significant other; has sisters and brothers close by and she is actively involved in her faith community.  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  She reports sleeping better recently and has a good appetite.  Amyrah denies a history of depression or anxiety and states she is in a positive mood most of the time.  She has minimal stress in her life.  Milani has goals to improve her health and increase her energy level.  She also wants to learn more about her condition and making positive healthy choices.   Staff will be following with Vaughan Basta throughout the course of this program.  Expected Outcomes Taylah will benefit from consistent exercise to achieve her stated goals.  She will also benefit from the educational components of this program to learn more about healthy choices.   Continue Psychosocial Services  Follow up required by staff      Psychosocial Re-Evaluation:   Psychosocial Discharge (Final Psychosocial Re-Evaluation):   Vocational Rehabilitation: Provide vocational rehab assistance to qualifying candidates.   Vocational Rehab Evaluation & Intervention:     Vocational Rehab - 08/29/16 1433      Initial Vocational Rehab Evaluation & Intervention   Assessment shows need for Vocational Rehabilitation No      Education: Education Goals: Education classes will be provided on a weekly basis, covering required topics. Participant will state understanding/return demonstration of topics presented.  Learning Barriers/Preferences:     Learning Barriers/Preferences - 08/29/16 1430      Learning Barriers/Preferences   Learning Barriers Sight;Hearing   Learning Preferences Individual Instruction      Education Topics: General Nutrition Guidelines/Fats and Fiber: -Group instruction provided by verbal, written material, models and posters to present the general guidelines for heart healthy nutrition. Gives an explanation and review of dietary fats and fiber.   Controlling Sodium/Reading Food Labels: -Group verbal and written material supporting the discussion of sodium use in heart healthy nutrition. Review and explanation with models, verbal and written materials for utilization of the food label.   Exercise Physiology & Risk Factors: - Group verbal and written instruction with models to review the exercise physiology of the cardiovascular system and associated critical values. Details cardiovascular disease risk factors and the goals associated with each risk factor.    Cardiac Rehab from 09/09/2016 in [facility] Cardiac and Pulmonary Rehab  Date  09/02/16  Educator  [facility]  Instruction Review Code  2- meets goals/outcomes      Aerobic Exercise & Resistance Training: - Gives group verbal and written discussion on the health impact of inactivity. On the components of aerobic and resistive training programs and the benefits of this training and how to safely progress through these programs.   Cardiac Rehab from 09/09/2016 in [facility] Cardiac and Pulmonary Rehab  Date  09/04/16  Educator  AS  Instruction Review Code  2- meets goals/outcomes      Flexibility, Balance, General Exercise Guidelines: - Provides group verbal and written instruction on the benefits of flexibility and balance training programs. Provides general exercise guidelines with specific guidelines to those with heart or lung disease. Demonstration and skill practice provided.   Cardiac Rehab from 09/09/2016 in [facility] Cardiac and Pulmonary Rehab  Date  09/09/16  Educator  AS  Instruction Review Code  2- meets goals/outcomes      Stress Management: - Provides group verbal and written instruction about the health risks of elevated stress, cause of high stress, and healthy ways to reduce stress.   Depression: - Provides group verbal and written instruction on the correlation between heart/lung disease and depressed mood, treatment options, and the stigmas associated with seeking treatment.   Anatomy & Physiology of the Heart: - Group verbal and written instruction and models provide basic cardiac anatomy and physiology, with the coronary electrical and arterial systems. Review of: AMI, Angina, Valve disease, Heart Failure, Cardiac Arrhythmia, Pacemakers, and the ICD.   Cardiac Procedures: - Group verbal and written instruction and models to describe the testing methods done to diagnose heart disease. Reviews the outcomes of the test results. Describes the treatment choices: Medical Management,  Angioplasty, or Coronary Bypass Surgery.  Cardiac Medications: - Group verbal and written instruction to review commonly prescribed medications for heart disease. Reviews the medication, class of the drug, and side effects. Includes the steps to properly store meds and maintain the prescription regimen.   Go Sex-Intimacy & Heart Disease, Get SMART - Goal Setting: - Group verbal and written instruction through game format to discuss heart disease and the return to sexual intimacy. Provides group verbal and written material to discuss and apply goal setting through the application of the S.M.A.R.T. Method.   Other Matters of the Heart: - Provides group verbal, written materials and models to describe Heart Failure, Angina, Valve Disease, and Diabetes in the realm of heart disease. Includes description of the disease process and treatment options available to the cardiac patient.   Exercise & Equipment Safety: - Individual verbal instruction and demonstration of equipment use and safety with use of the equipment.   Cardiac Rehab from 09/09/2016 in [facility] Cardiac and Pulmonary Rehab  Date  08/29/16  Educator  SB  Instruction Review Code  2- meets goals/outcomes      Infection Prevention: - Provides verbal and written material to individual with discussion of infection control including proper hand washing and proper equipment cleaning during exercise session.   Cardiac Rehab from 09/09/2016 in [facility] Cardiac and Pulmonary Rehab  Date  08/29/16  Educator  SB  Instruction Review Code  2- meets goals/outcomes      Falls Prevention: - Provides verbal and written material to individual with discussion of falls prevention and safety.   Cardiac Rehab from 09/09/2016 in [facility] Cardiac and Pulmonary Rehab  Date  08/29/16  Educator  SB  Instruction Review Code  2- meets goals/outcomes      Diabetes: - Individual verbal and written instruction to review signs/symptoms of diabetes, desired ranges  of glucose level fasting, after meals and with exercise. Advice that pre and post exercise glucose checks will be done for 3 sessions at entry of program.   Cardiac Rehab from 09/09/2016 in [facility] Cardiac and Pulmonary Rehab  Date  08/29/16  Educator  SB  Instruction Review Code  2- meets goals/outcomes       Knowledge Questionnaire Score:     Knowledge Questionnaire Score - 08/29/16 1431      Knowledge Questionnaire Score   Pre Score 19/28  Correct answers reviewed with Vaughan Basta      Core Components/Risk Factors/Patient Goals at Admission:     Personal Goals and Risk Factors at Admission - 08/29/16 1422      Core Components/Risk Factors/Patient Goals on Admission    Weight Management Obesity;Yes   Intervention Weight Management: Develop a combined nutrition and exercise program designed to reach desired caloric intake, while maintaining appropriate intake of nutrient and fiber, sodium and fats, and appropriate energy expenditure required for the weight goal.   Admit Weight 244 lb (110.7 kg)   Goal Weight: Short Term 240 lb (108.9 kg)   Goal Weight: Long Term 150 lb (68 kg)   Expected Outcomes Short Term: Continue to assess and modify interventions until short term weight is achieved;Long Term: Adherence to nutrition and physical activity/exercise program aimed toward attainment of established weight goal;Weight Loss: Understanding of general recommendations for a balanced deficit meal plan, which promotes 1-2 lb weight loss per week and includes a negative energy balance of [phone] kcal/d   Diabetes Yes   Intervention Provide education about signs/symptoms and action to take for hypo/hyperglycemia.;Provide education about proper nutrition, including hydration, and aerobic/resistive exercise prescription  along with prescribed medications to achieve blood glucose in normal ranges: Fasting glucose 65-99 mg/dL   Expected Outcomes Short Term: Participant verbalizes understanding of the  signs/symptoms and immediate care of hyper/hypoglycemia, proper foot care and importance of medication, aerobic/resistive exercise and nutrition plan for blood glucose control.;Long Term: Attainment of HbA1C < 7%.   Heart Failure Yes   Intervention Provide a combined exercise and nutrition program that is supplemented with education, support and counseling about heart failure. Directed toward relieving symptoms such as shortness of breath, decreased exercise tolerance, and extremity edema.   Expected Outcomes Improve functional capacity of life   Hypertension Yes   Intervention Provide education on lifestyle modifcations including regular physical activity/exercise, weight management, moderate sodium restriction and increased consumption of fresh fruit, vegetables, and low fat dairy, alcohol moderation, and smoking cessation.;Monitor prescription use compliance.   Expected Outcomes Short Term: Continued assessment and intervention until BP is < 140/95m HG in hypertensive participants. < 130/865mHG in hypertensive participants with diabetes, heart failure or chronic kidney disease.;Long Term: Maintenance of blood pressure at goal levels.   Lipids Yes   Intervention Provide education and support for participant on nutrition & aerobic/resistive exercise along with prescribed medications to achieve LDL <7054mHDL >78m18m Expected Outcomes Short Term: Participant states understanding of desired cholesterol values and is compliant with medications prescribed. Participant is following exercise prescription and nutrition guidelines.;Long Term: Cholesterol controlled with medications as prescribed, with individualized exercise RX and with personalized nutrition plan. Value goals: LDL < 70mg67mL > 40 mg.   Stress Yes  Unable to perform yard work and household activities   Intervention Offer individual and/or small group education and counseling on adjustment to heart disease, stress management and  health-related lifestyle change. Teach and support self-help strategies.;Refer participants experiencing significant psychosocial distress to appropriate mental health specialists for further evaluation and treatment. When possible, include family members and significant others in education/counseling sessions.   Expected Outcomes Short Term: Participant demonstrates changes in health-related behavior, relaxation and other stress management skills, ability to obtain effective social support, and compliance with psychotropic medications if prescribed.;Long Term: Emotional wellbeing is indicated by absence of clinically significant psychosocial distress or social isolation.      Core Components/Risk Factors/Patient Goals Review:    Core Components/Risk Factors/Patient Goals at Discharge (Final Review):    ITP Comments:     ITP Comments    Row Name 08/29/16 1415 09/11/16 0714         ITP Comments Med Review completed, Initial ITP created. Diagnosis can be found in Care Everywhere 5/31 30 day review. Continue with ITP unless directed changes per Medical Director review    New to program         Comments:

## 2016-09-12 DIAGNOSIS — I110 Hypertensive heart disease with heart failure: Secondary | ICD-10-CM | POA: Diagnosis not present

## 2016-09-12 DIAGNOSIS — I5022 Chronic systolic (congestive) heart failure: Secondary | ICD-10-CM

## 2016-09-12 NOTE — Progress Notes (Signed)
Daily Session Note  Patient Details  Name: Ivalee Strauser MRN: [id number] Date of Birth: 02-Aug-1960 Referring Provider:     Cardiac Rehab from 08/29/2016 in [facility] Cardiac and Pulmonary Rehab  Referring Provider  [name]      Encounter Date: 09/12/2016  Check In:     Session Check In - 09/12/16 1632      Check-In   Location [facility]-Cardiac & Pulmonary Rehab   Staff Present Earlean Shawl, BS, ACSM CEP, Exercise Physiologist;Joseph Alcus Dad, RN BSN   Supervising physician immediately available to respond to emergencies See telemetry face sheet for immediately available ER MD   Medication changes reported     No   Fall or balance concerns reported    No   Warm-up and Cool-down Performed on first and last piece of equipment   Resistance Training Performed Yes   VAD Patient? No     Pain Assessment   Currently in Pain? No/denies   Multiple Pain Sites No         History  Smoking Status  . Former Smoker  . Packs/day: 1.50  . Years: 25.00  . Types: Cigarettes  Smokeless Tobacco  . Never Used    Comment: "quit smoking cigarettes ~ 2009"    Goals Met:  Independence with exercise equipment Exercise tolerated well No report of cardiac concerns or symptoms Strength training completed today  Goals Unmet:  Not Applicable  Comments: Pt able to follow exercise prescription today without complaint.  Will continue to monitor for progression.   Dr. Emily Filbert is Medical Director for [place] and [facility].

## 2016-09-16 DIAGNOSIS — I110 Hypertensive heart disease with heart failure: Secondary | ICD-10-CM | POA: Diagnosis not present

## 2016-09-16 DIAGNOSIS — I5022 Chronic systolic (congestive) heart failure: Secondary | ICD-10-CM

## 2016-09-16 NOTE — Progress Notes (Signed)
Daily Session Note  Patient Details  Name: Raven Watson MRN: [id number] Date of Birth: 01/17/1961 Referring Provider:     Cardiac Rehab from 08/29/2016 in [facility] Cardiac and Pulmonary Rehab  Referring Provider  [name]      Encounter Date: 09/16/2016  Check In:     Session Check In - 09/16/16 1710      Check-In   Location [facility]-Cardiac & Pulmonary Rehab   Staff Present Nada Maclachlan, BA, ACSM CEP, Exercise Physiologist;Kelly Amedeo Plenty, BS, ACSM CEP, Exercise Physiologist;Meredith Sherryll Burger, RN BSN   Supervising physician immediately available to respond to emergencies See telemetry face sheet for immediately available ER MD   Medication changes reported     No   Fall or balance concerns reported    No   Warm-up and Cool-down Performed on first and last piece of equipment   Resistance Training Performed Yes   VAD Patient? No     Pain Assessment   Currently in Pain? No/denies         History  Smoking Status  . Former Smoker  . Packs/day: 1.50  . Years: 25.00  . Types: Cigarettes  Smokeless Tobacco  . Never Used    Comment: "quit smoking cigarettes ~ 2009"    Goals Met:  Independence with exercise equipment Exercise tolerated well No report of cardiac concerns or symptoms Strength training completed today  Goals Unmet:  Not Applicable  Comments: Pt able to follow exercise prescription today without complaint.  Will continue to monitor for progression.    Dr. Emily Filbert is Medical Director for [place] and [facility].

## 2016-09-18 ENCOUNTER — Encounter: Payer: Medicare Other | Admitting: *Deleted

## 2016-09-18 DIAGNOSIS — I5022 Chronic systolic (congestive) heart failure: Secondary | ICD-10-CM

## 2016-09-18 DIAGNOSIS — I110 Hypertensive heart disease with heart failure: Secondary | ICD-10-CM | POA: Diagnosis not present

## 2016-09-18 NOTE — Progress Notes (Signed)
Daily Session Note  Patient Details  Name: Raven Watson MRN: [id number] Date of Birth: 03-02-1960 Referring Provider:     Cardiac Rehab from 08/29/2016 in [facility] Cardiac and Pulmonary Rehab  Referring Provider  [name]      Encounter Date: 09/18/2016  Check In:     Session Check In - 09/18/16 1621      Check-In   Location [facility]Cardiac [facility] Pulmonary Rehab   Staff Present Renita Papa, RN BSN;Carroll Enterkin, RN, Vickki Hearing, BA, ACSM CEP, Exercise Physiologist   Supervising physician immediately available to respond to emergencies See telemetry face sheet for immediately available ER MD   Medication changes reported     No   Fall or balance concerns reported    No   Warm-up and Cool-down Performed on first and last piece of equipment   Resistance Training Performed Yes   VAD Patient? No     Pain Assessment   Currently in Pain? No/denies           Exercise Prescription Changes - 09/18/16 1200      Response to Exercise   Blood Pressure (Admit) 142/78   Blood Pressure (Exercise) 138/90   Blood Pressure (Exit) 132/84   Heart Rate (Admit) 85 bpm   Heart Rate (Exercise) 89 bpm   Heart Rate (Exit) 85 bpm   Rating of Perceived Exertion (Exercise) 15   Symptoms none   Duration Continue with 45 min of aerobic exercise without signs/symptoms of physical distress.   Intensity THRR unchanged     Progression   Progression Continue to progress workloads to maintain intensity without signs/symptoms of physical distress.   Average METs 2.2     Resistance Training   Training Prescription Yes   Weight 2   Reps 10-15     Interval Training   Interval Training No     NuStep   Level 2   SPM 80   Minutes 15   METs 2.4     Biostep-RELP   Level 2   SPM 50   Minutes 15   METs 2      History  Smoking Status  . Former Smoker  . Packs/day: 1.50  . Years: 25.00  . Types: Cigarettes  Smokeless Tobacco  . Never Used    Comment: "quit smoking cigarettes ~ 2009"     Goals Met:  Proper associated with RPD/PD & O2 Sat Independence with exercise equipment Exercise tolerated well No report of cardiac concerns or symptoms Strength training completed today  Goals Unmet:  Not Applicable  Comments: Pt able to follow exercise prescription today without complaint.  Will continue to monitor for progression.    Dr. Emily Filbert is Medical Director for [place] and [facility].

## 2016-09-19 DIAGNOSIS — I5022 Chronic systolic (congestive) heart failure: Secondary | ICD-10-CM

## 2016-09-19 DIAGNOSIS — I110 Hypertensive heart disease with heart failure: Secondary | ICD-10-CM | POA: Diagnosis not present

## 2016-09-19 NOTE — Progress Notes (Signed)
Daily Session Note  Patient Details  Name: Sherita Decoste MRN: [id number] Date of Birth: [birth year] Referring Provider:     Cardiac Rehab from 08/29/2016 in [facility] Cardiac and Pulmonary Rehab  Referring Provider  [name]      Encounter Date: 09/19/2016  Check In:     Session Check In - 09/19/16 1651      Check-In   Location [facility]Cardiac & Pulmonary Rehab   Staff Present Nada Maclachlan, BA, ACSM CEP, Exercise Physiologist;Kelly Amedeo Plenty, BS, ACSM CEP, Exercise Physiologist;Carroll Enterkin, RN, BSN;Gabryel Files [employer] physician immediately available to respond to emergencies See telemetry face sheet for immediately available ER MD   Medication changes reported     No   Fall or balance concerns reported    No   Warm-up and Cool-down Performed on first and last piece of equipment   Resistance Training Performed Yes   VAD Patient? No     Pain Assessment   Currently in Pain? No/denies   Multiple Pain Sites No         History  Smoking Status  . Former Smoker  . Packs/day: 1.50  . Years: 25.00  . Types: Cigarettes  Smokeless Tobacco  . Never Used    Comment: "quit smoking cigarettes ~ 2009"    Goals Met:  Independence with exercise equipment Exercise tolerated well No report of cardiac concerns or symptoms Strength training completed today  Goals Unmet:  Not Applicable  Comments: Pt able to follow exercise prescription today without complaint.  Will continue to monitor for progression.   Dr. Emily Filbert is Medical Director for [place] and [facility].

## 2016-09-23 DIAGNOSIS — I5022 Chronic systolic (congestive) heart failure: Secondary | ICD-10-CM

## 2016-09-23 DIAGNOSIS — I110 Hypertensive heart disease with heart failure: Secondary | ICD-10-CM | POA: Diagnosis not present

## 2016-09-23 NOTE — Progress Notes (Signed)
Daily Session Note  Patient Details  Name: Raven Watson MRN: [id number] Date of Birth: Oct 19, 1960 Referring Provider:     Cardiac Rehab from 08/29/2016 in [facility] Cardiac and Pulmonary Rehab  Referring Provider  [name]      Encounter Date: 09/23/2016  Check In:     Session Check In - 09/23/16 1714      Check-In   Location [facility]Cardiac [facility] Pulmonary Rehab   Staff Present Nada Maclachlan, BA, ACSM CEP, Exercise Physiologist;Kelly Amedeo Plenty, BS, ACSM CEP, Exercise Physiologist;Meredith Sherryll Burger, RN BSN   Supervising physician immediately available to respond to emergencies See telemetry face sheet for immediately available ER MD   Medication changes reported     No   Fall or balance concerns reported    No   Warm-up and Cool-down Performed on first and last piece of equipment   Resistance Training Performed Yes   VAD Patient? No     Pain Assessment   Currently in Pain? No/denies         History  Smoking Status  . Former Smoker  . Packs/day: 1.50  . Years: 25.00  . Types: Cigarettes  Smokeless Tobacco  . Never Used    Comment: "quit smoking cigarettes ~ 2009"    Goals Met:  Independence with exercise equipment Exercise tolerated well No report of cardiac concerns or symptoms Strength training completed today  Goals Unmet:  Not Applicable  Comments: Pt able to follow exercise prescription today without complaint.  Will continue to monitor for progression.    Dr. Emily Filbert is Medical Director for [place] and [facility].

## 2016-09-25 ENCOUNTER — Encounter: Payer: Medicare Other | Attending: Family Medicine | Admitting: *Deleted

## 2016-09-25 DIAGNOSIS — E11610 Type 2 diabetes mellitus with diabetic neuropathic arthropathy: Secondary | ICD-10-CM | POA: Insufficient documentation

## 2016-09-25 DIAGNOSIS — I110 Hypertensive heart disease with heart failure: Secondary | ICD-10-CM | POA: Diagnosis not present

## 2016-09-25 DIAGNOSIS — E7800 Pure hypercholesterolemia, unspecified: Secondary | ICD-10-CM | POA: Diagnosis not present

## 2016-09-25 DIAGNOSIS — Z87891 Personal history of nicotine dependence: Secondary | ICD-10-CM | POA: Diagnosis not present

## 2016-09-25 DIAGNOSIS — I5022 Chronic systolic (congestive) heart failure: Secondary | ICD-10-CM | POA: Diagnosis present

## 2016-09-25 NOTE — Progress Notes (Signed)
Daily Session Note  Patient Details  Name: Raven Watson MRN: [id number] Date of Birth: [birth year] Referring Provider:     Cardiac Rehab from 08/29/2016 in [facility] Cardiac and Pulmonary Rehab  Referring Provider  [name]      Encounter Date: 09/25/2016  Check In:     Session Check In - 09/25/16 1642      Check-In   Location [facility] Pulmonary Re[facility]   Staff Present Gerlene Burdock, RN, BSN;Meredith Sherryll Burger, RN Vickki Hearing, BA, ACSM CEP, Exercise Physiologist   Supervising physician immediately available to respond to emergencies See telemetry face sheet for immediately available ER MD   Medication changes reported     No   Fall or balance concerns reported    No   Tobacco Cessation No Change   Warm-up and Cool-down Performed on first and last piece of equipment   Resistance Training Performed Yes   VAD Patient? No     Pain Assessment   Currently in Pain? No/denies         History  Smoking Status  . Former Smoker  . Packs/day: 1.50  . Years: 25.00  . Types: Cigarettes  Smokeless Tobacco  . Never Used    Comment: "quit smoking cigarettes ~ 2009"    Goals Met:  Proper associated with RPD/PD & O2 Sat Exercise tolerated well No report of cardiac concerns or symptoms Strength training completed today  Goals Unmet:  Not Applicable  Comments:     Dr. Emily Filbert is Medical Director for [place] and [facility].

## 2016-09-26 DIAGNOSIS — I5022 Chronic systolic (congestive) heart failure: Secondary | ICD-10-CM

## 2016-09-26 DIAGNOSIS — I110 Hypertensive heart disease with heart failure: Secondary | ICD-10-CM | POA: Diagnosis not present

## 2016-09-26 NOTE — Progress Notes (Signed)
Daily Session Note  Patient Details  Name: Raven Watson MRN: [id number] Date of Birth: [birth year] Referring Provider:     Cardiac Rehab from 08/29/2016 in [facility] Cardiac and Pulmonary Rehab  Referring Provider  [name]      Encounter Date: 09/26/2016  Check In:     Session Check In - 09/26/16 1640      Check-In   Staff Present Gerlene Burdock, RN, Vickki Hearing, BA, ACSM CEP, Exercise Physiologist;Kelly Amedeo Plenty, BS, ACSM CEP, Exercise Physiologist;Joseph Flavia Shipper   Supervising physician immediately available to respond to emergencies See telemetry face sheet for immediately available ER MD   Medication changes reported     No   Fall or balance concerns reported    No   Warm-up and Cool-down Performed on first and last piece of equipment   Resistance Training Performed Yes   VAD Patient? No     Pain Assessment   Currently in Pain? No/denies   Multiple Pain Sites No         History  Smoking Status  . Former Smoker  . Packs/day: 1.50  . Years: 25.00  . Types: Cigarettes  Smokeless Tobacco  . Never Used    Comment: "quit smoking cigarettes ~ 2009"    Goals Met:  Independence with exercise equipment Exercise tolerated well No report of cardiac concerns or symptoms Strength training completed today  Goals Unmet:  Not Applicable  Comments: Pt able to follow exercise prescription today without complaint.  Will continue to monitor for progression.   Dr. Emily Filbert is Medical Director for [place] and [facility].

## 2016-09-30 ENCOUNTER — Encounter: Payer: Medicare Other | Admitting: *Deleted

## 2016-09-30 DIAGNOSIS — I110 Hypertensive heart disease with heart failure: Secondary | ICD-10-CM | POA: Diagnosis not present

## 2016-09-30 DIAGNOSIS — I5022 Chronic systolic (congestive) heart failure: Secondary | ICD-10-CM

## 2016-09-30 NOTE — Progress Notes (Signed)
Daily Session Note  Patient Details  Name: Emrys Mceachron MRN: [id number] Date of Birth: 20-Jun-1960 Referring Provider:     Cardiac Rehab from 08/29/2016 in [facility] Cardiac and Pulmonary Rehab  Referring Provider  [name]      Encounter Date: 09/30/2016  Check In:     Session Check In - 09/30/16 1719      Check-In   Location [facility]-Cardiac & Pulmonary Rehab   Staff Present Nyoka Cowden, RN, BSN, Bonnita Hollow, BS, ACSM CEP, Exercise Physiologist;Other  Carson Myrtle, RRT   Supervising physician immediately available to respond to emergencies See telemetry face sheet for immediately available ER MD   Medication changes reported     No   Fall or balance concerns reported    No   Tobacco Cessation No Change   Warm-up and Cool-down Performed on first and last piece of equipment   Resistance Training Performed Yes   VAD Patient? No     Pain Assessment   Currently in Pain? No/denies         History  Smoking Status  . Former Smoker  . Packs/day: 1.50  . Years: 25.00  . Types: Cigarettes  Smokeless Tobacco  . Never Used    Comment: "quit smoking cigarettes ~ 2009"    Goals Met:  Proper associated with RPD/PD & O2 Sat Exercise tolerated well No report of cardiac concerns or symptoms Strength training completed today  Goals Unmet:  Not Applicable  Comments:     Dr. Emily Filbert is Medical Director for [place] and [facility].

## 2016-10-02 ENCOUNTER — Encounter: Payer: Medicare Other | Admitting: *Deleted

## 2016-10-02 DIAGNOSIS — I5022 Chronic systolic (congestive) heart failure: Secondary | ICD-10-CM

## 2016-10-02 DIAGNOSIS — I110 Hypertensive heart disease with heart failure: Secondary | ICD-10-CM | POA: Diagnosis not present

## 2016-10-02 NOTE — Progress Notes (Signed)
Daily Session Note  Patient Details  Name: Raven Watson MRN: [id number] Date of Birth: [birth year] Referring Provider:     Cardiac Rehab from 08/29/2016 in [facility] Cardiac and Pulmonary Rehab  Referring Provider  [name]      Encounter Date: 10/02/2016  Check In:     Session Check In - 10/02/16 1729      Check-In   Location [facility]Cardiac & Pulmonary Rehab   Staff Present Nyoka Cowden, RN, BSN, MA;Susanne Bice, RN, BSN, CCRP;Tykee Heideman Sherryll Burger, RN BSN   Supervising physician immediately available to respond to emergencies See telemetry face sheet for immediately available ER MD   Medication changes reported     No   Fall or balance concerns reported    No   Warm-up and Cool-down Performed on first and last piece of equipment   Resistance Training Performed Yes   VAD Patient? No     Pain Assessment   Currently in Pain? No/denies         History  Smoking Status  . Former Smoker  . Packs/day: 1.50  . Years: 25.00  . Types: Cigarettes  Smokeless Tobacco  . Never Used    Comment: "quit smoking cigarettes ~ 2009"    Goals Met:  Proper associated with RPD/PD & O2 Sat Independence with exercise equipment Exercise tolerated well No report of cardiac concerns or symptoms Strength training completed today  Goals Unmet:  Not Applicable  Comments: Pt able to follow exercise prescription today without complaint.  Will continue to monitor for progression.    Dr. Emily Filbert is Medical Director for [place] and [facility].

## 2016-10-03 ENCOUNTER — Encounter: Payer: Medicare Other | Admitting: *Deleted

## 2016-10-03 DIAGNOSIS — I110 Hypertensive heart disease with heart failure: Secondary | ICD-10-CM | POA: Diagnosis not present

## 2016-10-03 DIAGNOSIS — I5022 Chronic systolic (congestive) heart failure: Secondary | ICD-10-CM

## 2016-10-03 NOTE — Progress Notes (Signed)
Daily Session Note  Patient Details  Name: Raven Watson MRN: [id number] Date of Birth: December 16, 1960 Referring Provider:     Cardiac Rehab from 08/29/2016 in [facility] Cardiac and Pulmonary Rehab  Referring Provider  [name]      Encounter Date: 10/03/2016  Check In:     Session Check In - 10/03/16 1715      Check-In   Location [facility]Cardiac & Pulmonary Rehab   Staff Present Heath Lark, RN, BSN, CCRP;Carroll Enterkin, RN, BSN;Joseph [employer] physician immediately available to respond to emergencies See telemetry face sheet for immediately available ER MD   Medication changes reported     No   Fall or balance concerns reported    No   Warm-up and Cool-down Performed on first and last piece of equipment   Resistance Training Performed Yes   VAD Patient? No     Pain Assessment   Currently in Pain? No/denies         History  Smoking Status  . Former Smoker  . Packs/day: 1.50  . Years: 25.00  . Types: Cigarettes  Smokeless Tobacco  . Never Used    Comment: "quit smoking cigarettes ~ 2009"    Goals Met:  Exercise tolerated well No report of cardiac concerns or symptoms Strength training completed today  Goals Unmet:  Not Applicable  Comments: Doing well with exercise prescription progression.     Dr. Emily Filbert is Medical Director for [place] and [facility].

## 2016-10-07 DIAGNOSIS — I5022 Chronic systolic (congestive) heart failure: Secondary | ICD-10-CM

## 2016-10-07 DIAGNOSIS — I110 Hypertensive heart disease with heart failure: Secondary | ICD-10-CM | POA: Diagnosis not present

## 2016-10-07 NOTE — Progress Notes (Signed)
Daily Session Note  Patient Details  Name: Raven Watson MRN: [id number] Date of Birth: [birth year] Referring Provider:     Cardiac Rehab from 08/29/2016 in [facility] Cardiac and Pulmonary Rehab  Referring Provider  [name]      Encounter Date: 10/07/2016  Check In:     Session Check In - 10/07/16 1732      Check-In   Location [facility]Cardiac [facility] Pulmonary Rehab   Staff Present Nada Maclachlan, BA, ACSM CEP, Exercise Physiologist;Kelly Amedeo Plenty, BS, ACSM CEP, Exercise Physiologist;Meredith Sherryll Burger, RN BSN   Supervising physician immediately available to respond to emergencies See telemetry face sheet for immediately available ER MD   Medication changes reported     No   Fall or balance concerns reported    No   Warm-up and Cool-down Performed on first and last piece of equipment   Resistance Training Performed Yes   VAD Patient? No     Pain Assessment   Currently in Pain? No/denies         History  Smoking Status  . Former Smoker  . Packs/day: 1.50  . Years: 25.00  . Types: Cigarettes  Smokeless Tobacco  . Never Used    Comment: "quit smoking cigarettes ~ 2009"    Goals Met:  Independence with exercise equipment Exercise tolerated well No report of cardiac concerns or symptoms Strength training completed today  Goals Unmet:  Not Applicable  Comments: Pt able to follow exercise prescription today without complaint.  Will continue to monitor for progression.    Dr. Emily Filbert is Medical Director for [place] and [facility].

## 2016-10-09 ENCOUNTER — Encounter: Payer: Self-pay | Admitting: *Deleted

## 2016-10-09 DIAGNOSIS — I110 Hypertensive heart disease with heart failure: Secondary | ICD-10-CM | POA: Diagnosis not present

## 2016-10-09 DIAGNOSIS — I5022 Chronic systolic (congestive) heart failure: Secondary | ICD-10-CM

## 2016-10-09 NOTE — Progress Notes (Signed)
Daily Session Note  Patient Details  Name: Raven Watson MRN: [id number] Date of Birth: 08-07-1960 Referring Provider:     Cardiac Rehab from 08/29/2016 in [facility] Cardiac and Pulmonary Rehab  Referring Provider  [name]      Encounter Date: 10/09/2016  Check In:     Session Check In - 10/09/16 1617      Check-In   Location [facility]-Cardiac & Pulmonary Rehab   Staff Present Gerlene Burdock, RN, Levie Heritage, MA, ACSM RCEP, Exercise Physiologist;Amanda Oletta Darter, BA, ACSM CEP, Exercise Physiologist;Joseph Alcus Dad, RN BSN   Supervising physician immediately available to respond to emergencies See telemetry face sheet for immediately available ER MD   Medication changes reported     No   Fall or balance concerns reported    No   Warm-up and Cool-down Performed on first and last piece of equipment   Resistance Training Performed Yes   VAD Patient? No     Pain Assessment   Currently in Pain? No/denies   Multiple Pain Sites No         History  Smoking Status  . Former Smoker  . Packs/day: 1.50  . Years: 25.00  . Types: Cigarettes  Smokeless Tobacco  . Never Used    Comment: "quit smoking cigarettes ~ 2009"    Goals Met:  Proper associated with RPD/PD & O2 Sat Exercise tolerated well No report of cardiac concerns or symptoms Strength training completed today  Goals Unmet:  Not Applicable  Comments:     Dr. Emily Filbert is Medical Director for [place] and [facility].

## 2016-10-09 NOTE — Progress Notes (Signed)
Daily Session Note  Patient Details  Name: Raven Watson MRN: [id number] Date of Birth: 1960/08/20 Referring Provider:     Cardiac Rehab from 08/29/2016 in [facility] Cardiac and Pulmonary Rehab  Referring Provider  [name]      Encounter Date: 10/09/2016  Check In:     Session Check In - 10/09/16 1617      Check-In   Location [facility]Cardiac & Pulmonary Rehab   Staff Present Gerlene Burdock, RN, Levie Heritage, MA, ACSM RCEP, Exercise Physiologist;Amanda Oletta Darter, BA, ACSM CEP, Exercise Physiologist;Ketan Renz Alcus Dad, RN BSN   Supervising physician immediately available to respond to emergencies See telemetry face sheet for immediately available ER MD   Medication changes reported     No   Fall or balance concerns reported    No   Warm-up and Cool-down Performed on first and last piece of equipment   Resistance Training Performed Yes   VAD Patient? No     Pain Assessment   Currently in Pain? No/denies   Multiple Pain Sites No         History  Smoking Status  . Former Smoker  . Packs/day: 1.50  . Years: 25.00  . Types: Cigarettes  Smokeless Tobacco  . Never Used    Comment: "quit smoking cigarettes ~ 2009"    Goals Met:  Independence with exercise equipment Exercise tolerated well No report of cardiac concerns or symptoms Strength training completed today  Goals Unmet:  Not Applicable  Comments: Pt able to follow exercise prescription today without complaint.  Will continue to monitor for progression.   Dr. Emily Filbert is Medical Director for [place] and [facility].

## 2016-10-09 NOTE — Progress Notes (Signed)
Cardiac Individual Treatment Plan  Patient Details  Name: Raven Watson MRN: [id number] Date of Birth: [birth year] Referring Provider:     Cardiac Rehab from 08/29/2016 in [facility] Cardiac and Pulmonary Rehab  Referring Provider  [name]      Initial Encounter Date:    Cardiac Rehab from 08/29/2016 in [facility] Cardiac and Pulmonary Rehab  Date  08/29/16  Referring Provider  [name]      Visit Diagnosis: Heart failure, chronic systolic ([place])  Patient's Home Medications on Admission:  Current Outpatient Prescriptions:  .  aspirin 81 MG tablet, Take 81 mg by mouth daily.  , Disp: , Rfl:  .  docusate sodium (COLACE) 100 MG capsule, Take 100 mg by mouth 2 (two) times daily.  , Disp: , Rfl:  .  enalapril (VASOTEC) 10 MG tablet, Take 10 mg by mouth daily.  , Disp: , Rfl:  .  gabapentin (NEURONTIN) 300 MG capsule, Take 300 mg by mouth., Disp: , Rfl:  .  gemfibrozil (LOPID) 600 MG tablet, Take 600 mg by mouth 2 (two) times daily before a meal.  , Disp: , Rfl:  .  hydrochlorothiazide (MICROZIDE) 12.5 MG capsule, Take 12.5 mg by mouth daily.  , Disp: , Rfl:  .  insulin aspart (NOVOLOG) 100 UNIT/ML injection, Inject 5 Units into the skin 3 (three) times daily before meals. Sliding scale, Disp: , Rfl:  .  insulin glargine (LANTUS) 100 UNIT/ML injection, Inject 35 Units into the skin at bedtime. , Disp: , Rfl:  .  lovastatin (MEVACOR) 40 MG tablet, Take 80 mg by mouth at bedtime.  , Disp: , Rfl:   Past Medical History: Past Medical History:  Diagnosis Date  . Anemia 08/30/2010   microcytic anemia  . Blood transfusion 08/2010  . Diabetes mellitus   . Headache(784.0)   . Hypercholesteremia   . Hypertension   . 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 Former Smoker  . Packs/day: 1.50  .  Years: 25.00  . Types: Cigarettes  Smokeless Tobacco  . Never Used    Comment: "quit smoking cigarettes ~ 2009"    Labs: Recent Review Flowsheet Data    Labs for ITP Cardiac and Pulmonary Rehab Latest Ref Rng & Units 08/26/2010 02/05/2011   Hemoglobin A1c <5.7 % 12.6(H) 9.0(H)       Exercise Target Goals:    Exercise Program Goal: Individual exercise prescription set with THRR, safety & activity barriers. Participant demonstrates ability to understand and report RPE using BORG scale, to self-measure pulse accurately, and to acknowledge the importance of the exercise prescription.  Exercise Prescription Goal: Starting with aerobic activity 30 plus minutes a day, 3 days per week for initial exercise prescription. Provide home exercise prescription and guidelines that participant acknowledges understanding prior to discharge.  Activity Barriers & Risk Stratification:   6 Minute Walk:     6 Minute Walk    Row Name 08/29/16 1409         6 Minute Walk   Phase Initial     Distance 800 feet     Walk Time 5 minutes     # of Rest Breaks 1  stopped at 5 min     MPH 1.8     METS 2.55     RPE 13  VO2 Peak 8.93     Symptoms Yes (comment)     Comments back pain 10/10     Resting HR 82 bpm     Resting BP 132/88     Max Ex. HR 95 bpm     Max Ex. BP 160/78     2 Minute Post BP 126/82        Oxygen Initial Assessment:   Oxygen Re-Evaluation:   Oxygen Discharge (Final Oxygen Re-Evaluation):   Initial Exercise Prescription:     Initial Exercise Prescription - 08/29/16 1400      Date of Initial Exercise RX and Referring Provider   Date 08/29/16   Referring Provider [name]     Treadmill   MPH 1.8   Grade 0.5   Minutes 15  3/3/3   METs 2.5     Recumbant Bike   Level 1   RPM 50   Watts 20   Minutes 15   METs 2.5     NuStep   Level 2   SPM 80   Minutes 15   METs 2.5     Biostep-RELP   Level 2   SPM 50   Minutes 15   METs 2.5     Track   Minutes  15     Prescription Details   Frequency (times per week) 3   Duration Progress to 30 minutes of continuous aerobic without signs/symptoms of physical distress     Intensity   THRR 40-80% of Max Heartrate 116-150   Ratings of Perceived Exertion 11-13     Resistance Training   Training Prescription Yes   Weight 2   Reps 10-15      Perform Capillary Blood Glucose checks as needed.  Exercise Prescription Changes:     Exercise Prescription Changes    Row Name 08/29/16 1400 09/06/16 1000 09/18/16 1200 10/01/16 1100       Response to Exercise   Blood Pressure (Admit) 132/88 122/82 142/78 130/86    Blood Pressure (Exercise) 160/78 138/84 138/90 142/82    Blood Pressure (Exit) 126/82 110/72 132/84 122/72    Heart Rate (Admit) 88 bpm 99 bpm 85 bpm 61 bpm    Heart Rate (Exercise) 78 bpm 104 bpm 89 bpm 97 bpm    Heart Rate (Exit) 82 bpm 88 bpm 85 bpm 86 bpm    Oxygen Saturation (Admit) 100 %  -  -  -    Oxygen Saturation (Exercise) 99 %  -  -  -    Rating of Perceived Exertion (Exercise) _0 Symptoms  - none none none    Duration  - Continue with 45 min of aerobic exercise without signs/symptoms of physical distress. Continue with 45 min of aerobic exercise without signs/symptoms of physical distress. Continue with 45 min of aerobic exercise without signs/symptoms of physical distress.    Intensity  - THRR unchanged THRR unchanged THRR unchanged      Progression   Progression  - Continue to progress workloads to maintain intensity without signs/symptoms of physical distress. Continue to progress workloads to maintain intensity without signs/symptoms of physical distress. Continue to progress workloads to maintain intensity without signs/symptoms of physical distress.    Average METs  - 3 2.2 2.22      Resistance Training   Training Prescription  - Yes Yes Yes    Weight  - 2 lb 2 2 lbs    Reps  - 10-15 10-15 10-15  Interval Training   Interval Training  - No No No       NuStep   Level  -  - 2 2    SPM  -  - 80  -    Minutes  -  - 15 15    METs  -  - 2.4 1.9      Biostep-RELP   Level  - _0 SPM  -  - 50  -    Minutes  - _1 METs  - _2 Track   Laps  -  -  - 38    Minutes  - 15  20 laps  - 15    METs  -  -  - 2.76       Exercise Comments:   Exercise Goals and Review:     Exercise Goals    Row Name 08/29/16 1411             Exercise Goals   Increase Physical Activity Yes       Intervention Provide advice, education, support and counseling about physical activity/exercise needs.;Develop an individualized exercise prescription for aerobic and resistive training based on initial evaluation findings, risk stratification, comorbidities and participant's personal goals.       Expected Outcomes Achievement of increased cardiorespiratory fitness and enhanced flexibility, muscular endurance and strength shown through measurements of functional capacity and personal statement of participant.       Increase Strength and Stamina Yes       Intervention Provide advice, education, support and counseling about physical activity/exercise needs.;Develop an individualized exercise prescription for aerobic and resistive training based on initial evaluation findings, risk stratification, comorbidities and participant's personal goals.       Expected Outcomes Achievement of increased cardiorespiratory fitness and enhanced flexibility, muscular endurance and strength shown through measurements of functional capacity and personal statement of participant.          Exercise Goals Re-Evaluation :     Exercise Goals Re-Evaluation    [facility] [facility] Name 09/06/16 1042 09/18/16 1223 10/01/16 1153         Exercise Goal Re-Evaluation   Exercise Goals Review Increase Physical Activity;Increase Strenth and Stamina Increase Physical Activity;Increase Strenth and Stamina Increase Physical Activity;Increase Strenth and Stamina     Comments Raven Watson is  walking the track instead of TM due to her back and prosthtetic leg.  She is tolerating exercise well. Raven Watson has attended regularly during her first month in HT.  She prefers walkiing the Track to TM due to her prosthesis. Raven Watson continues to do well in rehab.  She continues to do well walking on the track.  She is up to 38 laps already!  We will continue to monitor her progression.      Expected Outcomes Short - Raven Watson will attend class regularly.  Long - Raven Watson will continue to increase her laps walked and resistance on other machines. Short - Raven Watson will continue to attend regularly.  Long - Raven Watson will complete HT and continue to exercise on her own.  Short: Continue to work on increasing laps and workloads.  Long: Continue to exercise independently.        Discharge Exercise Prescription (Final Exercise Prescription Changes):     Exercise Prescription Changes - 10/01/16 1100      Response to Exercise   Blood Pressure (Admit) 130/86   Blood Pressure (Exercise) 142/82   Blood Pressure (  Exit) 122/72   Heart Rate (Admit) 61 bpm   Heart Rate (Exercise) 97 bpm   Heart Rate (Exit) 86 bpm   Rating of Perceived Exertion (Exercise) 12   Symptoms none   Duration Continue with 45 min of aerobic exercise without signs/symptoms of physical distress.   Intensity THRR unchanged     Progression   Progression Continue to progress workloads to maintain intensity without signs/symptoms of physical distress.   Average METs 2.22     Resistance Training   Training Prescription Yes   Weight 2 lbs   Reps 10-15     Interval Training   Interval Training No     NuStep   Level 2   Minutes 15   METs 1.9     Biostep-RELP   Level 2   Minutes 15   METs 2     Track   Laps 38   Minutes 15   METs 2.76      Nutrition:  Target Goals: Understanding of nutrition guidelines, daily intake of sodium '1500mg'$ , cholesterol '200mg'$ , calories 30% from fat and 7% or less from saturated fats, daily to have 5 or  more servings of fruits and vegetables.  Biometrics:     Pre Biometrics - 08/29/16 1408      Pre Biometrics   Height '5\' 8"'$  (1.727 m)   Weight 244 lb 6.4 oz (110.9 kg)   Waist Circumference 46 inches   Hip Circumference 48.5 inches   Waist to Hip Ratio 0.95 %   BMI (Calculated) 37.2       Nutrition Therapy Plan and Nutrition Goals:   Nutrition Discharge: Rate Your Plate Scores:     Nutrition Assessments - 08/29/16 1425      MEDFICTS Scores   Pre Score 41      Nutrition Goals Re-Evaluation:   Nutrition Goals Discharge (Final Nutrition Goals Re-Evaluation):   Psychosocial: Target Goals: Acknowledge presence or absence of significant depression and/or stress, maximize coping skills, provide positive support system. Participant is able to verbalize types and ability to use techniques and skills needed for reducing stress and depression.   Initial Review & Psychosocial Screening:     Initial Psych Review & Screening - 08/29/16 1426      Initial Review   Current issues with Current Sleep Concerns  wakes up hourly to catch breath, but improving     Family Dynamics   Good Support System? Yes  Significant other, Sister and family     Barriers   Psychosocial barriers to participate in program There are no identifiable barriers or psychosocial needs.     Screening Interventions   Interventions Encouraged to exercise;Program counselor consult      Quality of Life Scores:      Quality of Life - 08/29/16 1430      Quality of Life Scores   Health/Function Pre 21 %   Socioeconomic Pre 21 %   Psych/Spiritual Pre 21 %   Family Pre 21 %   GLOBAL Pre 21 %      PHQ-9: Recent Review Flowsheet Data    Depression screen [facility] 2/9 08/29/2016 11/14/2010 10/09/2010   Decreased Interest 0 0 0   Down, Depressed, Hopeless 1 0 0   PHQ - 2 Score 1 0 0   Altered sleeping 1 - -   Tired, decreased energy 3 - -   Change in appetite 1 - -   Feeling bad or failure about  yourself  1 - -   Trouble  concentrating 0 - -   Moving slowly or fidgety/restless 0 - -   Suicidal thoughts 0 - -   PHQ-9 Score 7 - -   Difficult doing work/chores Somewhat difficult - -     Interpretation of Total Score  Total Score Depression Severity:  1-4 = Minimal depression, 5-9 = Mild depression, 10-14 = Moderate depression, 15-19 = Moderately severe depression, 20-27 = Severe depression   Psychosocial Evaluation and Intervention:     Psychosocial Evaluation - 09/02/16 1654      Psychosocial Evaluation & Interventions   Interventions Encouraged to exercise with the program and follow exercise prescription   Comments Counselor met with Ms. Raven Watson) for initial psychosocial evaluation.  She turned 56 years old yesterday.  Raven Watson has a strong support system as she lives with her significant other; has sisters and brothers close by and she is actively involved in her faith community.  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  She reports sleeping better recently and has a good appetite.  Honora denies a history of depression or anxiety and states she is in a positive mood most of the time.  She has minimal stress in her life.  Shonika has goals to improve her health and increase her energy level.  She also wants to learn more about her condition and making positive healthy choices.  Staff will be following with Raven Watson throughout the course of this program.     Expected Outcomes Izabela will benefit from consistent exercise to achieve her stated goals.  She will also benefit from the educational components of this program to learn more about healthy choices.   Continue Psychosocial Services  Follow up required by staff      Psychosocial Re-Evaluation:   Psychosocial Discharge (Final Psychosocial Re-Evaluation):   Vocational Rehabilitation: Provide vocational rehab assistance to qualifying candidates.    Vocational Rehab Evaluation & Intervention:     Vocational Rehab - 08/29/16 1433      Initial Vocational Rehab Evaluation & Intervention   Assessment shows need for Vocational Rehabilitation No      Education: Education Goals: Education classes will be provided on a weekly basis, covering required topics. Participant will state understanding/return demonstration of topics presented.  Learning Barriers/Preferences:     Learning Barriers/Preferences - 08/29/16 1430      Learning Barriers/Preferences   Learning Barriers Sight;Hearing   Learning Preferences Individual Instruction      Education Topics: General Nutrition Guidelines/Fats and Fiber: -Group instruction provided by verbal, written material, models and posters to present the general guidelines for heart healthy nutrition. Gives an explanation and review of dietary fats and fiber.   Controlling Sodium/Reading Food Labels: -Group verbal and written material supporting the discussion of sodium use in heart healthy nutrition. Review and explanation with models, verbal and written materials for utilization of the food label.   Exercise Physiology & Risk Factors: - Group verbal and written instruction with models to review the exercise physiology of the cardiovascular system and associated critical values. Details cardiovascular disease risk factors and the goals associated with each risk factor.   Cardiac Rehab from 10/07/2016 in [facility] Cardiac and Pulmonary Rehab  Date  09/02/16  Educator  [facility]  Instruction Review Code  2- meets goals/outcomes      Aerobic Exercise & Resistance Training: - Gives group verbal and written discussion on the health impact of inactivity. On the components of aerobic and  resistive training programs and the benefits of this training and how to safely progress through these programs.   Cardiac Rehab from 10/07/2016 in [facility] Cardiac and Pulmonary Rehab  Date  09/04/16  Educator  AS  Instruction  Review Code  2- meets goals/outcomes      Flexibility, Balance, General Exercise Guidelines: - Provides group verbal and written instruction on the benefits of flexibility and balance training programs. Provides general exercise guidelines with specific guidelines to those with heart or lung disease. Demonstration and skill practice provided.   Cardiac Rehab from 10/07/2016 in [facility] Cardiac and Pulmonary Rehab  Date  09/09/16  Educator  AS  Instruction Review Code  2- meets goals/outcomes      Stress Management: - Provides group verbal and written instruction about the health risks of elevated stress, cause of high stress, and healthy ways to reduce stress.   Cardiac Rehab from 10/07/2016 in [facility] Cardiac and Pulmonary Rehab  Date  09/18/16  Educator  [facility]  Instruction Review Code  2- meets goals/outcomes      Depression: - Provides group verbal and written instruction on the correlation between heart/lung disease and depressed mood, treatment options, and the stigmas associated with seeking treatment.   Anatomy & Physiology of the Heart: - Group verbal and written instruction and models provide basic cardiac anatomy and physiology, with the coronary electrical and arterial systems. Review of: AMI, Angina, Valve disease, Heart Failure, Cardiac Arrhythmia, Pacemakers, and the ICD.   Cardiac Rehab from 10/07/2016 in [facility] Cardiac and Pulmonary Rehab  Date  09/16/16  Educator  [facility]  Instruction Review Code  2- meets goals/outcomes      Cardiac Procedures: - Group verbal and written instruction and models to describe the testing methods done to diagnose heart disease. Reviews the outcomes of the test results. Describes the treatment choices: Medical Management, Angioplasty, or Coronary Bypass Surgery.   Cardiac Rehab from 10/07/2016 in [facility] Cardiac and Pulmonary Rehab  Date  09/23/16  Educator  [facility]  Instruction Review Code  2- meets goals/outcomes      Cardiac Medications: - Group verbal  and written instruction to review commonly prescribed medications for heart disease. Reviews the medication, class of the drug, and side effects. Includes the steps to properly store meds and maintain the prescription regimen.   Cardiac Rehab from 10/07/2016 in [facility] Cardiac and Pulmonary Rehab  Date  09/30/16 [part 2 10/02/16]  Educator  MJA, RN [Part 2 SB]  Instruction Review Code  2- meets goals/outcomes      Go Sex-Intimacy & Heart Disease, Get SMART - Goal Setting: - Group verbal and written instruction through game format to discuss heart disease and the return to sexual intimacy. Provides group verbal and written material to discuss and apply goal setting through the application of the S.M.A.R.T. Method.   Cardiac Rehab from 10/07/2016 in [facility] Cardiac and Pulmonary Rehab  Date  09/23/16  Educator  [facility]  Instruction Review Code  2- meets goals/outcomes      Other Matters of the Heart: - Provides group verbal, written materials and models to describe Heart Failure, Angina, Valve Disease, and Diabetes in the realm of heart disease. Includes description of the disease process and treatment options available to the cardiac patient.   Cardiac Rehab from 10/07/2016 in [facility] Cardiac and Pulmonary Rehab  Date  09/16/16  Educator  [facility]  Instruction Review Code  2- meets goals/outcomes      Exercise & Equipment Safety: - Individual verbal instruction and  demonstration of equipment use and safety with use of the equipment.   Cardiac Rehab from 10/07/2016 in [facility] Cardiac and Pulmonary Rehab  Date  08/29/16  Educator  SB  Instruction Review Code  2- meets goals/outcomes      Infection Prevention: - Provides verbal and written material to individual with discussion of infection control including proper hand washing and proper equipment cleaning during exercise session.   Cardiac Rehab from 10/07/2016 in [facility] Cardiac and Pulmonary Rehab  Date  08/29/16  Educator  SB  Instruction Review Code  2-  meets goals/outcomes      Falls Prevention: - Provides verbal and written material to individual with discussion of falls prevention and safety.   Cardiac Rehab from 10/07/2016 in [facility] Cardiac and Pulmonary Rehab  Date  08/29/16  Educator  SB  Instruction Review Code  2- meets goals/outcomes      Diabetes: - Individual verbal and written instruction to review signs/symptoms of diabetes, desired ranges of glucose level fasting, after meals and with exercise. Advice that pre and post exercise glucose checks will be done for 3 sessions at entry of program.   Cardiac Rehab from 10/07/2016 in [facility] Cardiac and Pulmonary Rehab  Date  08/29/16  Educator  SB  Instruction Review Code  2- meets goals/outcomes       Knowledge Questionnaire Score:     Knowledge Questionnaire Score - 08/29/16 1431      Knowledge Questionnaire Score   Pre Score 19/28  Correct answers reviewed with Raven Watson      Core Components/Risk Factors/Patient Goals at Admission:     Personal Goals and Risk Factors at Admission - 08/29/16 1422      Core Components/Risk Factors/Patient Goals on Admission    Weight Management Obesity;Yes   Intervention Weight Management: Develop a combined nutrition and exercise program designed to reach desired caloric intake, while maintaining appropriate intake of nutrient and fiber, sodium and fats, and appropriate energy expenditure required for the weight goal.   Admit Weight 244 lb (110.7 kg)   Goal Weight: Short Term 240 lb (108.9 kg)   Goal Weight: Long Term 150 lb (68 kg)   Expected Outcomes Short Term: Continue to assess and modify interventions until short term weight is achieved;Long Term: Adherence to nutrition and physical activity/exercise program aimed toward attainment of established weight goal;Weight Loss: Understanding of general recommendations for a balanced deficit meal plan, which promotes 1-2 lb weight loss per week and includes a negative energy balance of  [phone] kcal/d   Diabetes Yes   Intervention Provide education about signs/symptoms and action to take for hypo/hyperglycemia.;Provide education about proper nutrition, including hydration, and aerobic/resistive exercise prescription along with prescribed medications to achieve blood glucose in normal ranges: Fasting glucose 65-99 mg/dL   Expected Outcomes Short Term: Participant verbalizes understanding of the signs/symptoms and immediate care of hyper/hypoglycemia, proper foot care and importance of medication, aerobic/resistive exercise and nutrition plan for blood glucose control.;Long Term: Attainment of HbA1C < 7%.   Heart Failure Yes   Intervention Provide a combined exercise and nutrition program that is supplemented with education, support and counseling about heart failure. Directed toward relieving symptoms such as shortness of breath, decreased exercise tolerance, and extremity edema.   Expected Outcomes Improve functional capacity of life   Hypertension Yes   Intervention Provide education on lifestyle modifcations including regular physical activity/exercise, weight management, moderate sodium restriction and increased consumption of fresh fruit, vegetables, and low fat dairy, alcohol moderation, and smoking cessation.;Monitor prescription  use compliance.   Expected Outcomes Short Term: Continued assessment and intervention until BP is < 140/43m HG in hypertensive participants. < 130/855mHG in hypertensive participants with diabetes, heart failure or chronic kidney disease.;Long Term: Maintenance of blood pressure at goal levels.   Lipids Yes   Intervention Provide education and support for participant on nutrition & aerobic/resistive exercise along with prescribed medications to achieve LDL <7066mHDL >66m70m Expected Outcomes Short Term: Participant states understanding of desired cholesterol values and is compliant with medications prescribed. Participant is following exercise  prescription and nutrition guidelines.;Long Term: Cholesterol controlled with medications as prescribed, with individualized exercise RX and with personalized nutrition plan. Value goals: LDL < 70mg60mL > 40 mg.   Stress Yes  Unable to perform yard work and household activities   Intervention Offer individual and/or small group education and counseling on adjustment to heart disease, stress management and health-related lifestyle change. Teach and support self-help strategies.;Refer participants experiencing significant psychosocial distress to appropriate mental health specialists for further evaluation and treatment. When possible, include family members and significant others in education/counseling sessions.   Expected Outcomes Short Term: Participant demonstrates changes in health-related behavior, relaxation and other stress management skills, ability to obtain effective social support, and compliance with psychotropic medications if prescribed.;Long Term: Emotional wellbeing is indicated by absence of clinically significant psychosocial distress or social isolation.      Core Components/Risk Factors/Patient Goals Review:      Goals and Risk Factor Review    Row Name 09/26/16 1023             Core Components/Risk Factors/Patient Goals Review   Personal Goals Review Improve shortness of breath with ADL's;Other       Review LindaAarionaes she has been breathing much better adn sleeping better at night.         Expected Outcomes Sort - LindaTrey [facility].  Long - LindaZoa be able to manage her condition successfully.          Core Components/Risk Factors/Patient Goals at Discharge (Final Review):      Goals and Risk Factor Review - 09/26/16 1023      Core Components/Risk Factors/Patient Goals Review   Personal Goals Review Improve shortness of breath with ADL's;Other   Review LindaZaileees she has been breathing much better adn sleeping  better at night.     Expected Outcomes Sort - LindaRoselynne [facility]  Long - LindaSuzannah be able to manage her condition successfully.      ITP Comments:     ITP Comments    Row Name 08/29/16 1415 09/11/16 0714 10/09/16 0634       ITP Comments Med Review completed, Initial ITP created. Diagnosis can be found in Care Everywhere 5/31 30 day review. Continue with ITP unless directed changes per Medical Director review    New to program 30 day review. Continue with ITP unless directed changes per Medical Director review         Comments:

## 2016-10-10 DIAGNOSIS — I5022 Chronic systolic (congestive) heart failure: Secondary | ICD-10-CM

## 2016-10-10 DIAGNOSIS — I110 Hypertensive heart disease with heart failure: Secondary | ICD-10-CM | POA: Diagnosis not present

## 2016-10-10 NOTE — Progress Notes (Signed)
Daily Session Note  Patient Details  Name: Raven Watson MRN: [id number] Date of Birth: 1960/10/27 Referring Provider:     Cardiac Rehab from 08/29/2016 in [facility] Cardiac and Pulmonary Rehab  Referring Provider  [name]      Encounter Date: 10/10/2016  Check In:     Session Check In - 10/10/16 1628      Check-In   Location [facility]-Cardiac & Pulmonary Rehab   Staff Present Gerlene Burdock, RN, BSN;Izear Pine Foy [place], [place], ACSM CEP, Exercise Physiologist   Supervising physician immediately available to respond to emergencies See telemetry face sheet for immediately available ER MD   Medication changes reported     No   Fall or balance concerns reported    No   Warm-up and Cool-down Performed on first and last piece of equipment   Resistance Training Performed Yes   VAD Patient? No     Pain Assessment   Currently in Pain? No/denies         History  Smoking Status  . Former Smoker  . Packs/day: 1.50  . Years: 25.00  . Types: Cigarettes  Smokeless Tobacco  . Never Used    Comment: "quit smoking cigarettes ~ 2009"    Goals Met:  Independence with exercise equipment Exercise tolerated well No report of cardiac concerns or symptoms Strength training completed today  Goals Unmet:  Not Applicable  Comments: Pt able to follow exercise prescription today without complaint.  Will continue to monitor for progression.   Dr. Emily Filbert is Medical Director for [place] and [facility].

## 2016-10-14 ENCOUNTER — Encounter: Payer: Medicare Other | Admitting: *Deleted

## 2016-10-14 DIAGNOSIS — I110 Hypertensive heart disease with heart failure: Secondary | ICD-10-CM | POA: Diagnosis not present

## 2016-10-14 DIAGNOSIS — I5022 Chronic systolic (congestive) heart failure: Secondary | ICD-10-CM

## 2016-10-14 NOTE — Progress Notes (Signed)
Daily Session Note  Patient Details  Name: Raven Watson MRN: [id number] Date of Birth: [birth year] Referring Provider:     Cardiac Rehab from 08/29/2016 in [facility] Cardiac and Pulmonary Rehab  Referring Provider  [name]      Encounter Date: 10/14/2016  Check In:     Session Check In - 10/14/16 1625      Check-In   Location [facility]-Cardiac & Pulmonary Rehab   Staff Present Nyoka Cowden, RN, BSN, MA;Violanda Bobeck Sherryll Burger, RN Moises Blood, BS, ACSM CEP, Exercise Physiologist   Supervising physician immediately available to respond to emergencies See telemetry face sheet for immediately available ER MD   Medication changes reported     No   Fall or balance concerns reported    No   Warm-up and Cool-down Performed on first and last piece of equipment   Resistance Training Performed Yes   VAD Patient? No     Pain Assessment   Currently in Pain? No/denies   Multiple Pain Sites No         History  Smoking Status  . Former Smoker  . Packs/day: 1.50  . Years: 25.00  . Types: Cigarettes  Smokeless Tobacco  . Never Used    Comment: "quit smoking cigarettes ~ 2009"    Goals Met:  Proper associated with RPD/PD & O2 Sat Independence with exercise equipment Exercise tolerated well No report of cardiac concerns or symptoms Strength training completed today  Goals Unmet:  Not Applicable  Comments: Pt able to follow exercise prescription today without complaint.  Will continue to monitor for progression.    Dr. Emily Filbert is Medical Director for [place] and [facility].

## 2016-10-16 ENCOUNTER — Encounter: Payer: Medicare Other | Admitting: *Deleted

## 2016-10-16 DIAGNOSIS — I5022 Chronic systolic (congestive) heart failure: Secondary | ICD-10-CM

## 2016-10-16 DIAGNOSIS — I110 Hypertensive heart disease with heart failure: Secondary | ICD-10-CM | POA: Diagnosis not present

## 2016-10-16 NOTE — Progress Notes (Signed)
Daily Session Note  Patient Details  Name: Raven Watson MRN: [id number] Date of Birth: 06/19/1960 Referring Provider:     Cardiac Rehab from 08/29/2016 in [facility] Cardiac and Pulmonary Rehab  Referring Provider  [name]      Encounter Date: 10/16/2016  Check In:     Session Check In - 10/16/16 1617      Check-In   Location [facility]-Cardiac & Pulmonary Rehab   Staff Present Renita Papa, RN Vickki Hearing, BA, ACSM CEP, Exercise Physiologist;Carroll Enterkin, RN, BSN   Supervising physician immediately available to respond to emergencies See telemetry face sheet for immediately available ER MD   Medication changes reported     No   Warm-up and Cool-down Performed on first and last piece of equipment   Resistance Training Performed Yes   VAD Patient? No     Pain Assessment   Currently in Pain? No/denies         History  Smoking Status  . Former Smoker  . Packs/day: 1.50  . Years: 25.00  . Types: Cigarettes  Smokeless Tobacco  . Never Used    Comment: "quit smoking cigarettes ~ 2009"    Goals Met:  Proper associated with RPD/PD & O2 Sat Independence with exercise equipment Exercise tolerated well No report of cardiac concerns or symptoms Strength training completed today  Goals Unmet:  Not Applicable  Comments: Pt able to follow exercise prescription today without complaint.  Will continue to monitor for progression.    Dr. Emily Filbert is Medical Director for [place] and [facility].

## 2016-10-17 DIAGNOSIS — I5022 Chronic systolic (congestive) heart failure: Secondary | ICD-10-CM

## 2016-10-17 DIAGNOSIS — I110 Hypertensive heart disease with heart failure: Secondary | ICD-10-CM | POA: Diagnosis not present

## 2016-10-17 NOTE — Progress Notes (Signed)
Daily Session Note  Patient Details  Name: Raven Watson MRN: [id number] Date of Birth: [birth year] Referring Provider:     Cardiac Rehab from 08/29/2016 in [facility] Cardiac and Pulmonary Rehab  Referring Provider  [name]      Encounter Date: 10/17/2016  Check In:     Session Check In - 10/17/16 1610      Check-In   Location [facility]-Cardiac & Pulmonary Rehab   Staff Present Earlean Shawl, BS, ACSM CEP, Exercise Physiologist;[place] Hibberd Tessie Fass RCP,RRT,BSRT;Carroll Enterkin, RN, BSN   Supervising physician immediately available to respond to emergencies See telemetry face sheet for immediately available ER MD   Medication changes reported     No   Fall or balance concerns reported    No   Warm-up and Cool-down Performed on first and last piece of equipment   Resistance Training Performed Yes   VAD Patient? No     Pain Assessment   Currently in Pain? No/denies   Multiple Pain Sites No         History  Smoking Status  . Former Smoker  . Packs/day: 1.50  . Years: 25.00  . Types: Cigarettes  Smokeless Tobacco  . Never Used    Comment: "quit smoking cigarettes ~ 2009"    Goals Met:  Independence with exercise equipment Exercise tolerated well No report of cardiac concerns or symptoms Strength training completed today  Goals Unmet:  Not Applicable  Comments: Pt able to follow exercise prescription today without complaint.  Will continue to monitor for progression.   Dr. Emily Filbert is Medical Director for [place] and [facility].

## 2016-10-21 VITALS — Ht 68.0 in | Wt 246.0 lb

## 2016-10-21 DIAGNOSIS — I110 Hypertensive heart disease with heart failure: Secondary | ICD-10-CM | POA: Diagnosis not present

## 2016-10-21 DIAGNOSIS — I5022 Chronic systolic (congestive) heart failure: Secondary | ICD-10-CM

## 2016-10-21 NOTE — Progress Notes (Signed)
Daily Session Note  Patient Details  Name: Raven Watson MRN: [id number] Date of Birth: Oct 24, 1960 Referring Provider:     Cardiac Rehab from 08/29/2016 in [facility] Cardiac and Pulmonary Rehab  Referring Provider  [name]      Encounter Date: 10/21/2016  Check In:     Session Check In - 10/21/16 1634      Check-In   Location [facility]-Cardiac & Pulmonary Rehab   Staff Present Nyoka Cowden, RN, BSN, Kela Millin, BA, ACSM CEP, Exercise Physiologist;Kelly Amedeo Plenty, BS, ACSM CEP, Exercise Physiologist   Supervising physician immediately available to respond to emergencies See telemetry face sheet for immediately available ER MD   Medication changes reported     No   Fall or balance concerns reported    No   Warm-up and Cool-down Performed on first and last piece of equipment   Resistance Training Performed Yes   VAD Patient? No     Pain Assessment   Currently in Pain? No/denies         History  Smoking Status  . Former Smoker  . Packs/day: 1.50  . Years: 25.00  . Types: Cigarettes  Smokeless Tobacco  . Never Used    Comment: "quit smoking cigarettes ~ 2009"    Goals Met:  Independence with exercise equipment Exercise tolerated well No report of cardiac concerns or symptoms Strength training completed today  Goals Unmet:  Not Applicable  Comments:      [place] Name 08/29/16 1409 10/21/16 1751       6 Minute Walk   Phase Initial Discharge    Distance 800 feet 1045 feet    Distance % Change  - 30 %    Distance Feet Change  - 245 ft    Walk Time 5 minutes 6 minutes    # of Rest Breaks 1  stopped at 5 min 0    MPH 1.8 1.97    METS 2.55 3.32    RPE 13 13    VO2 Peak 8.93 11.64    Symptoms Yes (comment) Yes (comment)    Comments back pain 10/10 back pain 6/10    Resting HR 82 bpm 94 bpm    Resting BP 132/88 128/74    Max Ex. HR 95 bpm 124 bpm    Max Ex. BP 160/78 162/74    2 Minute Post BP 126/82  -         Dr. Emily Filbert is Medical  Director for [place] and [facility].

## 2016-10-23 ENCOUNTER — Encounter: Payer: Medicare Other | Admitting: *Deleted

## 2016-10-23 DIAGNOSIS — I5022 Chronic systolic (congestive) heart failure: Secondary | ICD-10-CM

## 2016-10-23 DIAGNOSIS — I110 Hypertensive heart disease with heart failure: Secondary | ICD-10-CM | POA: Diagnosis not present

## 2016-10-23 NOTE — Progress Notes (Signed)
Daily Session Note  Patient Details  Name: Raven Watson MRN: [id number] Date of Birth: [birth year] Referring Provider:     Cardiac Rehab from 08/29/2016 in [facility] Cardiac and Pulmonary Rehab  Referring Provider  [name]      Encounter Date: 10/23/2016  Check In:     Session Check In - 10/23/16 1625      Check-In   Location [facility]-Cardiac & Pulmonary Rehab   Staff Present Nyoka Cowden, RN, BSN, MA;Cedric Mcclaine, RN, Vickki Hearing, BA, ACSM CEP, Exercise Physiologist   Supervising physician immediately available to respond to emergencies See telemetry face sheet for immediately available ER MD   Medication changes reported     No   Fall or balance concerns reported    No   Tobacco Cessation No Change   Warm-up and Cool-down Performed on first and last piece of equipment   Resistance Training Performed Yes   VAD Patient? No     Pain Assessment   Currently in Pain? No/denies         History  Smoking Status  . Former Smoker  . Packs/day: 1.50  . Years: 25.00  . Types: Cigarettes  Smokeless Tobacco  . Never Used    Comment: "quit smoking cigarettes ~ 2009"    Goals Met:  Proper associated with RPD/PD & O2 Sat Exercise tolerated well No report of cardiac concerns or symptoms Strength training completed today  Goals Unmet:  Not Applicable  Comments:     Dr. Emily Filbert is Medical Director for [place] and [facility].

## 2016-10-24 DIAGNOSIS — I110 Hypertensive heart disease with heart failure: Secondary | ICD-10-CM | POA: Diagnosis not present

## 2016-10-24 DIAGNOSIS — I5022 Chronic systolic (congestive) heart failure: Secondary | ICD-10-CM

## 2016-10-24 NOTE — Patient Instructions (Signed)
Discharge Instructions  Patient Details  Name: Raven Watson MRN: [id number] Date of Birth: [birth year] Referring Provider:  Marjean Donna, MD   Number of Visits:   Reason for Discharge:  Patient reached a stable level of exercise. Patient independent in their exercise. Patient has met program and personal goals.  Smoking History:  History  Smoking Status  . Former Smoker  . Packs/day: 1.50  . Years: 25.00  . Types: Cigarettes  Smokeless Tobacco  . Never Used    Comment: "quit smoking cigarettes ~ 2009"    Diagnosis:  Heart failure, chronic systolic (HCC)  Initial Exercise Prescription:     Initial Exercise Prescription - 08/29/16 1400      Date of Initial Exercise RX and Referring Provider   Date 08/29/16   Referring Provider [name]     Treadmill   MPH 1.8   Grade 0.5   Minutes 15  3/3/3   METs 2.5     Recumbant Bike   Level 1   RPM 50   Watts 20   Minutes 15   METs 2.5     NuStep   Level 2   SPM 80   Minutes 15   METs 2.5     Biostep-RELP   Level 2   SPM 50   Minutes 15   METs 2.5     Track   Minutes 15     Prescription Details   Frequency (times per week) 3   Duration Progress to 30 minutes of continuous aerobic without signs/symptoms of physical distress     Intensity   THRR 40-80% of Max Heartrate 116-150   Ratings of Perceived Exertion 11-13     Resistance Training   Training Prescription Yes   Weight 2   Reps 10-15      Discharge Exercise Prescription (Final Exercise Prescription Changes):     Exercise Prescription Changes - 10/18/16 1300      Response to Exercise   Blood Pressure (Admit) 142/70   Blood Pressure (Exercise) 144/78   Blood Pressure (Exit) 118/76   Heart Rate (Admit) 80 bpm   Heart Rate (Exercise) 94 bpm   Heart Rate (Exit) 88 bpm   Rating of Perceived Exertion (Exercise) 12   Symptoms none   Duration Continue with 45 min of aerobic exercise without signs/symptoms of physical distress.   Intensity THRR  unchanged     Progression   Progression Continue to progress workloads to maintain intensity without signs/symptoms of physical distress.   Average METs 2     Resistance Training   Training Prescription Yes   Weight 2 lb   Reps 10-15     Interval Training   Interval Training No     NuStep   Level 3   Minutes 15   METs 2     Biostep-RELP   Level 2   Minutes 15   METs 2      Functional Capacity:     6 Minute Walk    Row Name 08/29/16 1409 10/21/16 1751       6 Minute Walk   Phase Initial Discharge    Distance 800 feet 1045 feet    Distance % Change  - 30 %    Distance Feet Change  - 245 ft    Walk Time 5 minutes 6 minutes    # of Rest Breaks 1  stopped at 5 min 0    MPH 1.8 1.97    METS 2.55 3.32  RPE 13 13    VO2 Peak 8.93 11.64    Symptoms Yes (comment) Yes (comment)    Comments back pain 10/10 back pain 6/10    Resting HR 82 bpm 94 bpm    Resting BP 132/88 128/74    Max Ex. HR 95 bpm 124 bpm    Max Ex. BP 160/78 162/74    2 Minute Post BP 126/82  -       Quality of Life:     Quality of Life - 10/24/16 1348      Quality of Life Scores   Health/Function Post 27.2 %   Socioeconomic Post 30 %   Psych/Spiritual Post 29.14 %   Family Post 27.6 %   GLOBAL Post 28.18 %      Personal Goals: Goals established at orientation with interventions provided to work toward goal.     Personal Goals and Risk Factors at Admission - 08/29/16 1422      Core Components/Risk Factors/Patient Goals on Admission    Weight Management Obesity;Yes   Intervention Weight Management: Develop a combined nutrition and exercise program designed to reach desired caloric intake, while maintaining appropriate intake of nutrient and fiber, sodium and fats, and appropriate energy expenditure required for the weight goal.   Admit Weight 244 lb (110.7 kg)   Goal Weight: Short Term 240 lb (108.9 kg)   Goal Weight: Long Term 150 lb (68 kg)   Expected Outcomes Short Term:  Continue to assess and modify interventions until short term weight is achieved;Long Term: Adherence to nutrition and physical activity/exercise program aimed toward attainment of established weight goal;Weight Loss: Understanding of general recommendations for a balanced deficit meal plan, which promotes 1-2 lb weight loss per week and includes a negative energy balance of [id number] kcal/d   Diabetes Yes   Intervention Provide education about signs/symptoms and action to take for hypo/hyperglycemia.;Provide education about proper nutrition, including hydration, and aerobic/resistive exercise prescription along with prescribed medications to achieve blood glucose in normal ranges: Fasting glucose 65-99 mg/dL   Expected Outcomes Short Term: Participant verbalizes understanding of the signs/symptoms and immediate care of hyper/hypoglycemia, proper foot care and importance of medication, aerobic/resistive exercise and nutrition plan for blood glucose control.;Long Term: Attainment of HbA1C < 7%.   Heart Failure Yes   Intervention Provide a combined exercise and nutrition program that is supplemented with education, support and counseling about heart failure. Directed toward relieving symptoms such as shortness of breath, decreased exercise tolerance, and extremity edema.   Expected Outcomes Improve functional capacity of life   Hypertension Yes   Intervention Provide education on lifestyle modifcations including regular physical activity/exercise, weight management, moderate sodium restriction and increased consumption of fresh fruit, vegetables, and low fat dairy, alcohol moderation, and smoking cessation.;Monitor prescription use compliance.   Expected Outcomes Short Term: Continued assessment and intervention until BP is < 140/11m HG in hypertensive participants. < 130/866mHG in hypertensive participants with diabetes, heart failure or chronic kidney disease.;Long Term: Maintenance of blood pressure at  goal levels.   Lipids Yes   Intervention Provide education and support for participant on nutrition & aerobic/resistive exercise along with prescribed medications to achieve LDL <7020mHDL >73m69m Expected Outcomes Short Term: Participant states understanding of desired cholesterol values and is compliant with medications prescribed. Participant is following exercise prescription and nutrition guidelines.;Long Term: Cholesterol controlled with medications as prescribed, with individualized exercise RX and with personalized nutrition plan. Value goals: LDL < 70mg66mL > 40 mg.  Stress Yes  Unable to perform yard work and household activities   Intervention Offer individual and/or small group education and counseling on adjustment to heart disease, stress management and health-related lifestyle change. Teach and support self-help strategies.;Refer participants experiencing significant psychosocial distress to appropriate mental health specialists for further evaluation and treatment. When possible, include family members and significant others in education/counseling sessions.   Expected Outcomes Short Term: Participant demonstrates changes in health-related behavior, relaxation and other stress management skills, ability to obtain effective social support, and compliance with psychotropic medications if prescribed.;Long Term: Emotional wellbeing is indicated by absence of clinically significant psychosocial distress or social isolation.       Personal Goals Discharge:     Goals and Risk Factor Review - 10/18/16 1327      Core Components/Risk Factors/Patient Goals Review   Personal Goals Review Weight Management/Obesity;Diabetes;Hypertension   Review Kynadi is scheduled to meet with the RD.  her weight fluctuates but her BG and BP have been steady.     Expected Outcomes Short - Rhonna wil continue to improve overall fitness.  Long - Adrinne will maintain her exercise and dietary habits.       Exercise Goals and Review:     Exercise Goals    Row Name 08/29/16 1411             Exercise Goals   Increase Physical Activity Yes       Intervention Provide advice, education, support and counseling about physical activity/exercise needs.;Develop an individualized exercise prescription for aerobic and resistive training based on initial evaluation findings, risk stratification, comorbidities and participant's personal goals.       Expected Outcomes Achievement of increased cardiorespiratory fitness and enhanced flexibility, muscular endurance and strength shown through measurements of functional capacity and personal statement of participant.       Increase Strength and Stamina Yes       Intervention Provide advice, education, support and counseling about physical activity/exercise needs.;Develop an individualized exercise prescription for aerobic and resistive training based on initial evaluation findings, risk stratification, comorbidities and participant's personal goals.       Expected Outcomes Achievement of increased cardiorespiratory fitness and enhanced flexibility, muscular endurance and strength shown through measurements of functional capacity and personal statement of participant.          Nutrition & Weight - Outcomes:     Pre Biometrics - 08/29/16 1408      Pre Biometrics   Height 5' 8"  (1.727 m)   Weight 244 lb 6.4 oz (110.9 kg)   Waist Circumference 46 inches   Hip Circumference 48.5 inches   Waist to Hip Ratio 0.95 %   BMI (Calculated) 37.2         Post Biometrics - 10/21/16 1750       Post  Biometrics   Height 5' 8"  (1.727 m)   Weight 246 lb (111.6 kg)   Waist Circumference 46.5 inches   Hip Circumference 50 inches   Waist to Hip Ratio 0.93 %   BMI (Calculated) 37.41      Nutrition:   Nutrition Discharge:     Nutrition Assessments - 10/23/16 1803      MEDFICTS Scores   Post Score 6      Education Questionnaire Score:      Knowledge Questionnaire Score - 10/23/16 1802      Knowledge Questionnaire Score   Post Score 25      Goals reviewed with patient; copy given to patient.

## 2016-10-24 NOTE — Progress Notes (Signed)
Cardiac Individual Treatment Plan  Patient Details  Name: Kiyra Slaubaugh MRN: [id number] Date of Birth: 12-29-1960 Referring Provider:     Cardiac Rehab from 08/29/2016 in [facility] Cardiac and Pulmonary Rehab  Referring Provider  [name]      Initial Encounter Date:    Cardiac Rehab from 08/29/2016 in [facility] Cardiac and Pulmonary Rehab  Date  08/29/16  Referring Provider  [name]      Visit Diagnosis: Heart failure, chronic systolic ([place])  Patient's Home Medications on Admission:  Current Outpatient Prescriptions:  .  aspirin 81 MG tablet, Take 81 mg by mouth daily.  , Disp: , Rfl:  .  docusate sodium (COLACE) 100 MG capsule, Take 100 mg by mouth 2 (two) times daily.  , Disp: , Rfl:  .  enalapril (VASOTEC) 10 MG tablet, Take 10 mg by mouth daily.  , Disp: , Rfl:  .  gabapentin (NEURONTIN) 300 MG capsule, Take 300 mg by mouth., Disp: , Rfl:  .  gemfibrozil (LOPID) 600 MG tablet, Take 600 mg by mouth 2 (two) times daily before a meal.  , Disp: , Rfl:  .  hydrochlorothiazide (MICROZIDE) 12.5 MG capsule, Take 12.5 mg by mouth daily.  , Disp: , Rfl:  .  insulin aspart (NOVOLOG) 100 UNIT/ML injection, Inject 5 Units into the skin 3 (three) times daily before meals. Sliding scale, Disp: , Rfl:  .  insulin glargine (LANTUS) 100 UNIT/ML injection, Inject 35 Units into the skin at bedtime. , Disp: , Rfl:  .  lovastatin (MEVACOR) 40 MG tablet, Take 80 mg by mouth at bedtime.  , Disp: , Rfl:   Past Medical History: Past Medical History:  Diagnosis Date  . Anemia 08/30/2010   microcytic anemia  . Blood transfusion 08/2010  . Diabetes mellitus   . Headache(784.0)   . Hypercholesteremia   . Hypertension   . 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 Former Smoker  . Packs/day: 1.50  .  Years: 25.00  . Types: Cigarettes  Smokeless Tobacco  . Never Used    Comment: "quit smoking cigarettes ~ 2009"    Labs: Recent Review Flowsheet Data    Labs for ITP Cardiac and Pulmonary Rehab Latest Ref Rng & Units 08/26/2010 02/05/2011   Hemoglobin A1c <5.7 % 12.6(H) 9.0(H)       Exercise Target Goals:    Exercise Program Goal: Individual exercise prescription set with THRR, safety & activity barriers. Participant demonstrates ability to understand and report RPE using BORG scale, to self-measure pulse accurately, and to acknowledge the importance of the exercise prescription.  Exercise Prescription Goal: Starting with aerobic activity 30 plus minutes a day, 3 days per week for initial exercise prescription. Provide home exercise prescription and guidelines that participant acknowledges understanding prior to discharge.  Activity Barriers & Risk Stratification:   6 Minute Walk:     6 Minute Walk    Row Name 08/29/16 1409 10/21/16 1751       6 Minute Walk   Phase Initial Discharge    Distance 800 feet 1045 feet    Distance % Change  - 30 %    Distance Feet Change  - 245 ft    Walk Time 5 minutes 6 minutes    # of Rest Breaks 1  stopped at  5 min 0    MPH 1.8 1.97    METS 2.55 3.32    RPE 13 13    VO2 Peak 8.93 11.64    Symptoms Yes (comment) Yes (comment)    Comments back pain 10/10 back pain 6/10    Resting HR 82 bpm 94 bpm    Resting BP 132/88 128/74    Max Ex. HR 95 bpm 124 bpm    Max Ex. BP 160/78 162/74    2 Minute Post BP 126/82  -       Oxygen Initial Assessment:   Oxygen Re-Evaluation:   Oxygen Discharge (Final Oxygen Re-Evaluation):   Initial Exercise Prescription:     Initial Exercise Prescription - 08/29/16 1400      Date of Initial Exercise RX and Referring Provider   Date 08/29/16   Referring Provider [name]     Treadmill   MPH 1.8   Grade 0.5   Minutes 15  3/3/3   METs 2.5     Recumbant Bike   Level 1   RPM 50   Watts 20    Minutes 15   METs 2.5     NuStep   Level 2   SPM 80   Minutes 15   METs 2.5     Biostep-RELP   Level 2   SPM 50   Minutes 15   METs 2.5     Track   Minutes 15     Prescription Details   Frequency (times per week) 3   Duration Progress to 30 minutes of continuous aerobic without signs/symptoms of physical distress     Intensity   THRR 40-80% of Max Heartrate 116-150   Ratings of Perceived Exertion 11-13     Resistance Training   Training Prescription Yes   Weight 2   Reps 10-15      Perform Capillary Blood Glucose checks as needed.  Exercise Prescription Changes:     Exercise Prescription Changes    Row Name 08/29/16 1400 09/06/16 1000 09/18/16 1200 10/01/16 1100 10/18/16 1300     Response to Exercise   Blood Pressure (Admit) 132/88 122/82 142/78 130/86 142/70   Blood Pressure (Exercise) 160/78 138/84 138/90 142/82 144/78   Blood Pressure (Exit) 126/82 110/72 132/84 122/72 118/76   Heart Rate (Admit) 88 bpm 99 bpm 85 bpm 61 bpm 80 bpm   Heart Rate (Exercise) 78 bpm 104 bpm 89 bpm 97 bpm 94 bpm   Heart Rate (Exit) 82 bpm 88 bpm 85 bpm 86 bpm 88 bpm   Oxygen Saturation (Admit) 100 %  -  -  -  -   Oxygen Saturation (Exercise) 99 %  -  -  -  -   Rating of Perceived Exertion (Exercise) 13 15 15 12 12    Symptoms  - none none none none   Duration  - Continue with 45 min of aerobic exercise without signs/symptoms of physical distress. Continue with 45 min of aerobic exercise without signs/symptoms of physical distress. Continue with 45 min of aerobic exercise without signs/symptoms of physical distress. Continue with 45 min of aerobic exercise without signs/symptoms of physical distress.   Intensity  - THRR unchanged THRR unchanged THRR unchanged THRR unchanged     Progression   Progression  - Continue to progress workloads to maintain intensity without signs/symptoms of physical distress. Continue to progress workloads to maintain intensity without signs/symptoms of  physical distress. Continue to progress workloads to maintain intensity without signs/symptoms of physical distress. Continue  to progress workloads to maintain intensity without signs/symptoms of physical distress.   Average METs  - 3 2.2 2.22 2     Resistance Training   Training Prescription  - Yes Yes Yes Yes   Weight  - 2 lb 2 2 lbs 2 lb   Reps  - 10-15 10-15 10-15 10-15     Interval Training   Interval Training  - No No No No     NuStep   Level  -  - 2 2 3    SPM  -  - 80  -  -   Minutes  -  - 15 15 15    METs  -  - 2.4 1.9 2     Biostep-RELP   Level  - 2 2 2 2    SPM  -  - 50  -  -   Minutes  - 15 15 15 15    METs  - 3 2 2 2      Track   Laps  -  -  - 38  -   Minutes  - 15  20 laps  - 15  -   METs  -  -  - 2.76  -      Exercise Comments:   Exercise Goals and Review:     Exercise Goals    Row Name 08/29/16 1411             Exercise Goals   Increase Physical Activity Yes       Intervention Provide advice, education, support and counseling about physical activity/exercise needs.;Develop an individualized exercise prescription for aerobic and resistive training based on initial evaluation findings, risk stratification, comorbidities and participant's personal goals.       Expected Outcomes Achievement of increased cardiorespiratory fitness and enhanced flexibility, muscular endurance and strength shown through measurements of functional capacity and personal statement of participant.       Increase Strength and Stamina Yes       Intervention Provide advice, education, support and counseling about physical activity/exercise needs.;Develop an individualized exercise prescription for aerobic and resistive training based on initial evaluation findings, risk stratification, comorbidities and participant's personal goals.       Expected Outcomes Achievement of increased cardiorespiratory fitness and enhanced flexibility, muscular endurance and strength shown through  measurements of functional capacity and personal statement of participant.          Exercise Goals Re-Evaluation :     Exercise Goals Re-Evaluation    Row Name 09/06/16 1042 09/18/16 1223 10/01/16 1153 10/18/16 1326       Exercise Goal Re-Evaluation   Exercise Goals Review Increase Physical Activity;Increase Strenth and Stamina Increase Physical Activity;Increase Strenth and Stamina Increase Physical Activity;Increase Strenth and Stamina Increase Physical Activity;Increase Strenth and Stamina    Comments Makyia is walking the track instead of TM due to her back and prosthtetic leg.  She is tolerating exercise well. Lorenia has attended regularly during her first month in HT.  She prefers walkiing the Track to TM due to her prosthesis. Chessa continues to do well in rehab.  She continues to do well walking on the track.  She is up to 38 laps already!  We will continue to monitor her progression.  Elina is doing strength work and stretching on her own at home.  She states she has more energy and sleeps a lot better since beginnning HT.  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    Expected Outcomes Short - Adriyana will attend class  regularly.  Long - Staria will continue to increase her laps walked and resistance on other machines. Short - Blakleigh will continue to attend regularly.  Long - Lisaanne will complete HT and continue to exercise on her own.  Short: Continue to work on increasing laps and workloads.  Long: Continue to exercise independently. Short - Amey will complete HT.  Long - Raylee will maintain exercise upon graduation.       Discharge Exercise Prescription (Final Exercise Prescription Changes):     Exercise Prescription Changes - 10/18/16 1300      Response to Exercise   Blood Pressure (Admit) 142/70   Blood Pressure (Exercise) 144/78   Blood Pressure (Exit) 118/76   Heart Rate (Admit) 80 bpm   Heart Rate (Exercise) 94 bpm   Heart Rate (Exit) 88 bpm   Rating of Perceived Exertion (Exercise) 12    Symptoms none   Duration Continue with 45 min of aerobic exercise without signs/symptoms of physical distress.   Intensity THRR unchanged     Progression   Progression Continue to progress workloads to maintain intensity without signs/symptoms of physical distress.   Average METs 2     Resistance Training   Training Prescription Yes   Weight 2 lb   Reps 10-15     Interval Training   Interval Training No     NuStep   Level 3   Minutes 15   METs 2     Biostep-RELP   Level 2   Minutes 15   METs 2      Nutrition:  Target Goals: Understanding of nutrition guidelines, daily intake of sodium <1568m, cholesterol <2050m calories 30% from fat and 7% or less from saturated fats, daily to have 5 or more servings of fruits and vegetables.  Biometrics:     Pre Biometrics - 08/29/16 1408      Pre Biometrics   Height 5' 8"  (1.727 m)   Weight 244 lb 6.4 oz (110.9 kg)   Waist Circumference 46 inches   Hip Circumference 48.5 inches   Waist to Hip Ratio 0.95 %   BMI (Calculated) 37.2         Post Biometrics - 10/21/16 1750       Post  Biometrics   Height 5' 8"  (1.727 m)   Weight 246 lb (111.6 kg)   Waist Circumference 46.5 inches   Hip Circumference 50 inches   Waist to Hip Ratio 0.93 %   BMI (Calculated) 37.41      Nutrition Therapy Plan and Nutrition Goals:   Nutrition Discharge: Rate Your Plate Scores:     Nutrition Assessments - 10/23/16 1803      MEDFICTS Scores   Post Score 6      Nutrition Goals Re-Evaluation:   Nutrition Goals Discharge (Final Nutrition Goals Re-Evaluation):   Psychosocial: Target Goals: Acknowledge presence or absence of significant depression and/or stress, maximize coping skills, provide positive support system. Participant is able to verbalize types and ability to use techniques and skills needed for reducing stress and depression.   Initial Review & Psychosocial Screening:     Initial Psych Review & Screening -  08/29/16 1426      Initial Review   Current issues with Current Sleep Concerns  wakes up hourly to catch breath, but improving     Family Dynamics   Good Support System? Yes  Significant other, Sister and family     Barriers   Psychosocial barriers to participate in program There are no  identifiable barriers or psychosocial needs.     Screening Interventions   Interventions Encouraged to exercise;Program counselor consult      Quality of Life Scores:      Quality of Life - 10/24/16 1348      Quality of Life Scores   Health/Function Post 27.2 %   Socioeconomic Post 30 %   Psych/Spiritual Post 29.14 %   Family Post 27.6 %   GLOBAL Post 28.18 %      PHQ-9: Recent Review Flowsheet Data    Depression screen [facility] 2/9 10/23/2016 08/29/2016 11/14/2010 10/09/2010   Decreased Interest 0 0 0 0   Down, Depressed, Hopeless 0 1 0 0   PHQ - 2 Score 0 1 0 0   Altered sleeping 0 1 - -   Tired, decreased energy 0 3 - -   Change in appetite 0 1 - -   Feeling bad or failure about yourself  0 1 - -   Trouble concentrating 0 0 - -   Moving slowly or fidgety/restless 0 0 - -   Suicidal thoughts 0 0 - -   PHQ-9 Score 0 7 - -   Difficult doing work/chores Not difficult at all Somewhat difficult - -     Interpretation of Total Score  Total Score Depression Severity:  1-4 = Minimal depression, 5-9 = Mild depression, 10-14 = Moderate depression, 15-19 = Moderately severe depression, 20-27 = Severe depression   Psychosocial Evaluation and Intervention:     Psychosocial Evaluation - 09/02/16 1654      Psychosocial Evaluation & Interventions   Interventions Encouraged to exercise with the program and follow exercise prescription   Comments Counselor met with Ms. Lucita Lora) for initial psychosocial evaluation.  She turned 56 years old yesterday.  Christen has a strong support system as she lives with her significant other; has sisters and brothers close by and she is actively involved in her  faith community.  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  She reports sleeping better recently and has a good appetite.  Phillipa denies a history of depression or anxiety and states she is in a positive mood most of the time.  She has minimal stress in her life.  Kameelah has goals to improve her health and increase her energy level.  She also wants to learn more about her condition and making positive healthy choices.  Staff will be following with Vaughan Basta throughout the course of this program.     Expected Outcomes Marieta will benefit from consistent exercise to achieve her stated goals.  She will also benefit from the educational components of this program to learn more about healthy choices.   Continue Psychosocial Services  Follow up required by staff      Psychosocial Re-Evaluation:     Psychosocial Re-Evaluation    [place] Name 10/09/16 1703 10/18/16 1329 10/23/16 1734         Psychosocial Re-Evaluation   Current issues with  - Current Sleep Concerns  -     Comments Counselor follow up with Vaughan Basta today reporting feeling stronger overall; sleeping better; and enjoying the education and social aspect of this program.  She continues to struggle with chronic back pain and is activly trying to lose weight to address this according to her Dr.'s recommendations.  Counselor commended [place] for her progress made and commitment to her health and self-care.   Caliyah states she has  slept much better since beginning an exercise program. Counselor follow up with Vaughan Basta today reporting she will be graduating from this program next week and has benefitted tremendously.  She states her energy level has increased somewhat; she is able to do more with her spouse and normal activities; and she is sleeping better.  Cailie reports she and her spouse plan to exercise together consistently in the mornings once she graduates from this program.  She  admits to being skeptical when she first came in; but recognizes how positive it has been for her.  She reports the educational components have been helpful for her as well.   Counselor commended [place] on her hard work and commitment to improving her health.       Expected Outcomes  - Short - Mariadelaluz will continue to have restful sleep.  Long - Ayeisha will maintain overall sleep quality. Amara will continue to exercise consistently and reap the positive benefits of a healthier lifestyle.         Psychosocial Discharge (Final Psychosocial Re-Evaluation):     Psychosocial Re-Evaluation - 10/23/16 1734      Psychosocial Re-Evaluation   Comments Counselor follow up with Vaughan Basta today reporting she will be graduating from this program next week and has benefitted tremendously.  She states her energy level has increased somewhat; she is able to do more with her spouse and normal activities; and she is sleeping better.  Ysenia reports she and her spouse plan to exercise together consistently in the mornings once she graduates from this program.  She admits to being skeptical when she first came in; but recognizes how positive it has been for her.  She reports the educational components have been helpful for her as well.   Counselor commended [place] on her hard work and commitment to improving her health.     Expected Outcomes Marinell will continue to exercise consistently and reap the positive benefits of a healthier lifestyle.       Vocational Rehabilitation: Provide vocational rehab assistance to qualifying candidates.   Vocational Rehab Evaluation & Intervention:     Vocational Rehab - 08/29/16 1433      Initial Vocational Rehab Evaluation & Intervention   Assessment shows need for Vocational Rehabilitation No      Education: Education Goals: Education classes will be provided on a variety of topics geared toward better understanding of heart health and risk factor modification. Participant will  state understanding/return demonstration of topics presented as noted by education test scores.  Learning Barriers/Preferences:     Learning Barriers/Preferences - 08/29/16 1430      Learning Barriers/Preferences   Learning Barriers Sight;Hearing   Learning Preferences Individual Instruction      Education Topics: General Nutrition Guidelines/Fats and Fiber: -Group instruction provided by verbal, written material, models and posters to present the general guidelines for heart healthy nutrition. Gives an explanation and review of dietary fats and fiber.   Cardiac Rehab from 10/21/2016 in [facility] Cardiac and Pulmonary Rehab  Date  10/14/16  Educator  PI      Controlling Sodium/Reading Food Labels: -Group verbal and written material supporting the discussion of sodium use in heart healthy nutrition. Review and explanation with models, verbal and written materials for utilization of the food label.   Cardiac Rehab from 10/21/2016 in [facility] Cardiac and Pulmonary Rehab  Date  10/21/16  Educator  PI  Instruction Review Code  1- Verbalizes Understanding      Exercise Physiology & Risk Factors: - Group  verbal and written instruction with models to review the exercise physiology of the cardiovascular system and associated critical values. Details cardiovascular disease risk factors and the goals associated with each risk factor.   Cardiac Rehab from 10/21/2016 in [facility] Cardiac and Pulmonary Rehab  Date  09/02/16  Educator  [facility]      Aerobic Exercise & Resistance Training: - Gives group verbal and written discussion on the health impact of inactivity. On the components of aerobic and resistive training programs and the benefits of this training and how to safely progress through these programs.   Cardiac Rehab from 10/21/2016 in [facility] Cardiac and Pulmonary Rehab  Date  09/04/16  Educator  AS      Flexibility, Balance, General Exercise Guidelines: - Provides group verbal and written  instruction on the benefits of flexibility and balance training programs. Provides general exercise guidelines with specific guidelines to those with heart or lung disease. Demonstration and skill practice provided.   Cardiac Rehab from 10/21/2016 in [facility] Cardiac and Pulmonary Rehab  Date  09/09/16  Educator  AS      Stress Management: - Provides group verbal and written instruction about the health risks of elevated stress, cause of high stress, and healthy ways to reduce stress.   Cardiac Rehab from 10/21/2016 in [facility] Cardiac and Pulmonary Rehab  Date  09/18/16  Educator  [place]      Depression: - Provides group verbal and written instruction on the correlation between heart/lung disease and depressed mood, treatment options, and the stigmas associated with seeking treatment.   Cardiac Rehab from 10/21/2016 in [facility] Cardiac and Pulmonary Rehab  Date  10/16/16  Educator  [facility]  Instruction Review Code (retired)  2- meets Designer, fashion/clothing & Physiology of the Heart: - Group verbal and written instruction and models provide basic cardiac anatomy and physiology, with the coronary electrical and arterial systems. Review of: AMI, Angina, Valve disease, Heart Failure, Cardiac Arrhythmia, Pacemakers, and the ICD.   Cardiac Rehab from 10/21/2016 in [facility] Cardiac and Pulmonary Rehab  Date  09/16/16  Educator  [facility]      Cardiac Procedures: - Group verbal and written instruction to review commonly prescribed medications for heart disease. Reviews the medication, class of the drug, and side effects. Includes the steps to properly store meds and maintain the prescription regimen. (beta blockers and nitrates)   Cardiac Rehab from 10/21/2016 in [facility] Cardiac and Pulmonary Rehab  Date  09/23/16  Educator  [facility]      Cardiac Medications I: - Group verbal and written instruction to review commonly prescribed medications for heart disease. Reviews the medication, class of the drug, and side effects.  Includes the steps to properly store meds and maintain the prescription regimen.   Cardiac Rehab from 10/21/2016 in [facility] Cardiac and Pulmonary Rehab  Date  09/30/16 [part 2 10/02/16]  Educator  MJA, RN [Part 2 SB]      Cardiac Medications II: -Group verbal and written instruction to review commonly prescribed medications for heart disease. Reviews the medication, class of the drug, and side effects. (all other drug classes)    Go Sex-Intimacy & Heart Disease, Get SMART - Goal Setting: - Group verbal and written instruction through game format to discuss heart disease and the return to sexual intimacy. Provides group verbal and written material to discuss and apply goal setting through the application of the S.M.A.R.T. Method.   Cardiac Rehab from 10/21/2016 in [facility] Cardiac and Pulmonary Rehab  Date  09/23/16  Educator  [facility]      Other Matters of the Heart: - Provides group verbal, written materials and models to describe Heart Failure, Angina, Valve Disease, Peripheral Artery Disease, and Diabetes in the realm of heart disease. Includes description of the disease process and treatment options available to the cardiac patient.   Cardiac Rehab from 10/21/2016 in [facility] Cardiac and Pulmonary Rehab  Date  09/16/16  Educator  [facility]      Exercise & Equipment Safety: - Individual verbal instruction and demonstration of equipment use and safety with use of the equipment.   Cardiac Rehab from 10/21/2016 in [facility] Cardiac and Pulmonary Rehab  Date  08/29/16  Educator  SB      Infection Prevention: - Provides verbal and written material to individual with discussion of infection control including proper hand washing and proper equipment cleaning during exercise session.   Cardiac Rehab from 10/21/2016 in [facility] Cardiac and Pulmonary Rehab  Date  08/29/16  Educator  SB      Falls Prevention: - Provides verbal and written material to individual with discussion of falls prevention and safety.   Cardiac  Rehab from 10/21/2016 in [facility] Cardiac and Pulmonary Rehab  Date  08/29/16  Educator  SB  Instruction Review Code (retired)  2- meets goals/outcomes      Diabetes: - Individual verbal and written instruction to review signs/symptoms of diabetes, desired ranges of glucose level fasting, after meals and with exercise. Acknowledge that pre and post exercise glucose checks will be done for 3 sessions at entry of program.   Cardiac Rehab from 10/21/2016 in [facility] Cardiac and Pulmonary Rehab  Date  08/29/16  Educator  SB      Other: -Provides group and verbal instruction on various topics (see comments)    Knowledge Questionnaire Score:     Knowledge Questionnaire Score - 10/23/16 1802      Knowledge Questionnaire Score   Post Score 25      Core Components/Risk Factors/Patient Goals at Admission:     Personal Goals and Risk Factors at Admission - 08/29/16 1422      Core Components/Risk Factors/Patient Goals on Admission    Weight Management Obesity;Yes   Intervention Weight Management: Develop a combined nutrition and exercise program designed to reach desired caloric intake, while maintaining appropriate intake of nutrient and fiber, sodium and fats, and appropriate energy expenditure required for the weight goal.   Admit Weight 244 lb (110.7 kg)   Goal Weight: Short Term 240 lb (108.9 kg)   Goal Weight: Long Term 150 lb (68 kg)   Expected Outcomes Short Term: Continue to assess and modify interventions until short term weight is achieved;Long Term: Adherence to nutrition and physical activity/exercise program aimed toward attainment of established weight goal;Weight Loss: Understanding of general recommendations for a balanced deficit meal plan, which promotes 1-2 lb weight loss per week and includes a negative energy balance of [phone] kcal/d   Diabetes Yes   Intervention Provide education about signs/symptoms and action to take for hypo/hyperglycemia.;Provide education about  proper nutrition, including hydration, and aerobic/resistive exercise prescription along with prescribed medications to achieve blood glucose in normal ranges: Fasting glucose 65-99 mg/dL   Expected Outcomes Short Term: Participant verbalizes understanding of the signs/symptoms and immediate care of hyper/hypoglycemia, proper foot care and importance of medication, aerobic/resistive exercise and nutrition plan for blood glucose control.;Long Term: Attainment of HbA1C < 7%.   Heart Failure Yes   Intervention Provide a combined exercise and nutrition program  that is supplemented with education, support and counseling about heart failure. Directed toward relieving symptoms such as shortness of breath, decreased exercise tolerance, and extremity edema.   Expected Outcomes Improve functional capacity of life   Hypertension Yes   Intervention Provide education on lifestyle modifcations including regular physical activity/exercise, weight management, moderate sodium restriction and increased consumption of fresh fruit, vegetables, and low fat dairy, alcohol moderation, and smoking cessation.;Monitor prescription use compliance.   Expected Outcomes Short Term: Continued assessment and intervention until BP is < 140/61m HG in hypertensive participants. < 130/838mHG in hypertensive participants with diabetes, heart failure or chronic kidney disease.;Long Term: Maintenance of blood pressure at goal levels.   Lipids Yes   Intervention Provide education and support for participant on nutrition & aerobic/resistive exercise along with prescribed medications to achieve LDL <7035mHDL >15m38m Expected Outcomes Short Term: Participant states understanding of desired cholesterol values and is compliant with medications prescribed. Participant is following exercise prescription and nutrition guidelines.;Long Term: Cholesterol controlled with medications as prescribed, with individualized exercise RX and with personalized  nutrition plan. Value goals: LDL < 70mg51mL > 40 mg.   Stress Yes  Unable to perform yard work and household activities   Intervention Offer individual and/or small group education and counseling on adjustment to heart disease, stress management and health-related lifestyle change. Teach and support self-help strategies.;Refer participants experiencing significant psychosocial distress to appropriate mental health specialists for further evaluation and treatment. When possible, include family members and significant others in education/counseling sessions.   Expected Outcomes Short Term: Participant demonstrates changes in health-related behavior, relaxation and other stress management skills, ability to obtain effective social support, and compliance with psychotropic medications if prescribed.;Long Term: Emotional wellbeing is indicated by absence of clinically significant psychosocial distress or social isolation.      Core Components/Risk Factors/Patient Goals Review:      Goals and Risk Factor Review    Row Name 09/26/16 1023 10/18/16 1327           Core Components/Risk Factors/Patient Goals Review   Personal Goals Review Improve shortness of breath with ADL's;Other Weight Management/Obesity;Diabetes;Hypertension      Review LindaAniyiaes she has been breathing much better adn sleeping better at night.   LindaKayticheduled to meet with the RD.  her weight fluctuates but her BG and BP have been steady.        Expected Outcomes Sort - LindaEmiley [facility].  Long - LindaAlonie be able to manage her condition successfully. Short - LindaMikellcontinue to improve overall fitness.  Long - LindaSanaa maintain her exercise and dietary habits.         Core Components/Risk Factors/Patient Goals at Discharge (Final Review):      Goals and Risk Factor Review - 10/18/16 1327      Core Components/Risk Factors/Patient Goals Review   Personal Goals Review Weight  Management/Obesity;Diabetes;Hypertension   Review LindaLaurencheduled to meet with the RD.  her weight fluctuates but her BG and BP have been steady.     Expected Outcomes Short - LindaAuriannacontinue to improve overall fitness.  Long - LindaGizel maintain her exercise and dietary habits.      ITP Comments:     ITP Comments    Row Name 08/29/16 1415 09/11/16 0714 10/09/16 0634       ITP Comments Med Review completed, Initial ITP created. Diagnosis can be found in Care  Everywhere 5/31 30 day review. Continue with ITP unless directed changes per Medical Director review    New to program 30 day review. Continue with ITP unless directed changes per Medical Director review         Comments:

## 2016-10-24 NOTE — Progress Notes (Signed)
Daily Session Note  Patient Details  Name: Raven Watson MRN: [id number] Date of Birth: [birth year] Referring Provider:     Cardiac Rehab from 08/29/2016 in [facility] Cardiac and Pulmonary Rehab  Referring Provider  [name]      Encounter Date: 10/24/2016  Check In:     Session Check In - 10/24/16 1650      Check-In   Location [facility]-Cardiac & Pulmonary Rehab   Staff Present Gerlene Burdock, RN, Moises Blood, BS, ACSM CEP, Exercise Physiologist;Katheen Aslin Flavia Shipper   Supervising physician immediately available to respond to emergencies See telemetry face sheet for immediately available ER MD   Medication changes reported     No   Fall or balance concerns reported    No   Warm-up and Cool-down Performed on first and last piece of equipment   Resistance Training Performed Yes   VAD Patient? No     Pain Assessment   Currently in Pain? No/denies   Multiple Pain Sites No         History  Smoking Status  . Former Smoker  . Packs/day: 1.50  . Years: 25.00  . Types: Cigarettes  Smokeless Tobacco  . Never Used    Comment: "quit smoking cigarettes ~ 2009"    Goals Met:  Independence with exercise equipment Exercise tolerated well No report of cardiac concerns or symptoms Strength training completed today  Goals Unmet:  Not Applicable  Comments: Pt able to follow exercise prescription today without complaint.  Will continue to monitor for progression.   Dr. Emily Filbert is Medical Director for [facility].

## 2016-10-30 ENCOUNTER — Encounter: Payer: Medicare Other | Attending: Family Medicine | Admitting: *Deleted

## 2016-10-30 DIAGNOSIS — E11610 Type 2 diabetes mellitus with diabetic neuropathic arthropathy: Secondary | ICD-10-CM | POA: Insufficient documentation

## 2016-10-30 DIAGNOSIS — I5022 Chronic systolic (congestive) heart failure: Secondary | ICD-10-CM

## 2016-10-30 DIAGNOSIS — I110 Hypertensive heart disease with heart failure: Secondary | ICD-10-CM | POA: Diagnosis not present

## 2016-10-30 DIAGNOSIS — E7800 Pure hypercholesterolemia, unspecified: Secondary | ICD-10-CM | POA: Insufficient documentation

## 2016-10-30 DIAGNOSIS — Z87891 Personal history of nicotine dependence: Secondary | ICD-10-CM | POA: Insufficient documentation

## 2016-10-30 LAB — GLUCOSE, CAPILLARY: Glucose-Capillary: 253 mg/dL — ABNORMAL HIGH (ref 65–99)

## 2016-10-30 NOTE — Progress Notes (Signed)
Daily Session Note  Patient Details  Name: Raven Watson MRN: [id number] Date of Birth: 1960/11/27 Referring Provider:     Cardiac Rehab from 08/29/2016 in [facility] Cardiac and Pulmonary Rehab  Referring Provider  [name]      Encounter Date: 10/30/2016  Check In:     Session Check In - 10/30/16 1715      Check-In   Location [facility]-Cardiac & Pulmonary Rehab   Staff Present Gerlene Burdock, RN, Vickki Hearing, BA, ACSM CEP, Exercise Physiologist;Meredith Sherryll Burger, RN BSN   Supervising physician immediately available to respond to emergencies See telemetry face sheet for immediately available ER MD   Medication changes reported     No   Fall or balance concerns reported    No   Tobacco Cessation No Change   Warm-up and Cool-down Performed on first and last piece of equipment   Resistance Training Performed Yes   VAD Patient? No     Pain Assessment   Currently in Pain? No/denies         History  Smoking Status  . Former Smoker  . Packs/day: 1.50  . Years: 25.00  . Types: Cigarettes  Smokeless Tobacco  . Never Used    Comment: "quit smoking cigarettes ~ 2009"    Goals Met:  Proper associated with RPD/PD & O2 Sat Exercise tolerated well No report of cardiac concerns or symptoms Strength training completed today  Goals Unmet:  Not Applicable  Comments:     Dr. Emily Filbert is Medical Director for [place].

## 2016-10-30 NOTE — Progress Notes (Signed)
Discharge Progress Report  Patient Details  Name: Raven Watson MRN: [id number] Date of Birth: 1960-10-02 Referring Provider:     Cardiac Rehab from 08/29/2016 in [facility] Cardiac and Pulmonary Rehab  Referring Provider  Goldonna       Number of Visits: 67  Reason for Discharge:  Patient reached a stable level of exercise. Patient independent in their exercise. Patient has met program and personal goals.  Smoking History:  History  Smoking Status  . Former Smoker  . Packs/day: 1.50  . Years: 25.00  . Types: Cigarettes  Smokeless Tobacco  . Never Used    Comment: "quit smoking cigarettes ~ 2009"    Diagnosis:  Heart failure, chronic systolic (HCC)  ADL UCSD:   Initial Exercise Prescription:     Initial Exercise Prescription - 08/29/16 1400      Date of Initial Exercise RX and Referring Provider   Date 08/29/16   Referring Provider [name]     Treadmill   MPH 1.8   Grade 0.5   Minutes 15  3/3/3   METs 2.5     Recumbant Bike   Level 1   RPM 50   Watts 20   Minutes 15   METs 2.5     NuStep   Level 2   SPM 80   Minutes 15   METs 2.5     Biostep-RELP   Level 2   SPM 50   Minutes 15   METs 2.5     Track   Minutes 15     Prescription Details   Frequency (times per week) 3   Duration Progress to 30 minutes of continuous aerobic without signs/symptoms of physical distress     Intensity   THRR 40-80% of Max Heartrate 116-150   Ratings of Perceived Exertion 11-13     Resistance Training   Training Prescription Yes   Weight 2   Reps 10-15      Discharge Exercise Prescription (Final Exercise Prescription Changes):     Exercise Prescription Changes - 10/18/16 1300      Response to Exercise   Blood Pressure (Admit) 142/70   Blood Pressure (Exercise) 144/78   Blood Pressure (Exit) 118/76   Heart Rate (Admit) 80 bpm   Heart Rate (Exercise) 94 bpm   Heart Rate (Exit) 88 bpm   Rating of Perceived Exertion (Exercise) 12   Symptoms none   Duration  Continue with 45 min of aerobic exercise without signs/symptoms of physical distress.   Intensity THRR unchanged     Progression   Progression Continue to progress workloads to maintain intensity without signs/symptoms of physical distress.   Average METs 2     Resistance Training   Training Prescription Yes   Weight 2 lb   Reps 10-15     Interval Training   Interval Training No     NuStep   Level 3   Minutes 15   METs 2     Biostep-RELP   Level 2   Minutes 15   METs 2      Functional Capacity:     6 Minute Walk    Row Name 08/29/16 1409 10/21/16 1751       6 Minute Walk   Phase Initial Discharge    Distance 800 feet 1045 feet    Distance % Change  - 30 %    Distance Feet Change  - 245 ft    Walk Time 5 minutes 6 minutes    # of Rest  Breaks 1  stopped at 5 min 0    MPH 1.8 1.97    METS 2.55 3.32    RPE 13 13    VO2 Peak 8.93 11.64    Symptoms Yes (comment) Yes (comment)    Comments back pain 10/10 back pain 6/10    Resting HR 82 bpm 94 bpm    Resting BP 132/88 128/74    Max Ex. HR 95 bpm 124 bpm    Max Ex. BP 160/78 162/74    2 Minute Post BP 126/82  -       Psychological, QOL, Others - Outcomes: PHQ 2/9: Depression screen [facility] 2/9 10/23/2016 08/29/2016 11/14/2010 10/09/2010  Decreased Interest 0 0 0 0  Down, Depressed, Hopeless 0 1 0 0  PHQ - 2 Score 0 1 0 0  Altered sleeping 0 1 - -  Tired, decreased energy 0 3 - -  Change in appetite 0 1 - -  Feeling bad or failure about yourself  0 1 - -  Trouble concentrating 0 0 - -  Moving slowly or fidgety/restless 0 0 - -  Suicidal thoughts 0 0 - -  PHQ-9 Score 0 7 - -  Difficult doing work/chores Not difficult at all Somewhat difficult - -    Quality of Life:     Quality of Life - 10/24/16 1348      Quality of Life Scores   Health/Function Post 27.2 %   Socioeconomic Post 30 %   Psych/Spiritual Post 29.14 %   Family Post 27.6 %   GLOBAL Post 28.18 %      Personal Goals: Goals established at  orientation with interventions provided to work toward goal.     Personal Goals and Risk Factors at Admission - 08/29/16 1422      Core Components/Risk Factors/Patient Goals on Admission    Weight Management Obesity;Yes   Intervention Weight Management: Develop a combined nutrition and exercise program designed to reach desired caloric intake, while maintaining appropriate intake of nutrient and fiber, sodium and fats, and appropriate energy expenditure required for the weight goal.   Admit Weight 244 lb (110.7 kg)   Goal Weight: Short Term 240 lb (108.9 kg)   Goal Weight: Long Term 150 lb (68 kg)   Expected Outcomes Short Term: Continue to assess and modify interventions until short term weight is achieved;Long Term: Adherence to nutrition and physical activity/exercise program aimed toward attainment of established weight goal;Weight Loss: Understanding of general recommendations for a balanced deficit meal plan, which promotes 1-2 lb weight loss per week and includes a negative energy balance of [phone] kcal/d   Diabetes Yes   Intervention Provide education about signs/symptoms and action to take for hypo/hyperglycemia.;Provide education about proper nutrition, including hydration, and aerobic/resistive exercise prescription along with prescribed medications to achieve blood glucose in normal ranges: Fasting glucose 65-99 mg/dL   Expected Outcomes Short Term: Participant verbalizes understanding of the signs/symptoms and immediate care of hyper/hypoglycemia, proper foot care and importance of medication, aerobic/resistive exercise and nutrition plan for blood glucose control.;Long Term: Attainment of HbA1C < 7%.   Heart Failure Yes   Intervention Provide a combined exercise and nutrition program that is supplemented with education, support and counseling about heart failure. Directed toward relieving symptoms such as shortness of breath, decreased exercise tolerance, and extremity edema.    Expected Outcomes Improve functional capacity of life   Hypertension Yes   Intervention Provide education on lifestyle modifcations including regular physical activity/exercise, weight management, moderate  sodium restriction and increased consumption of fresh fruit, vegetables, and low fat dairy, alcohol moderation, and smoking cessation.;Monitor prescription use compliance.   Expected Outcomes Short Term: Continued assessment and intervention until BP is < 140/50m HG in hypertensive participants. < 130/853mHG in hypertensive participants with diabetes, heart failure or chronic kidney disease.;Long Term: Maintenance of blood pressure at goal levels.   Lipids Yes   Intervention Provide education and support for participant on nutrition & aerobic/resistive exercise along with prescribed medications to achieve LDL <7063mHDL >24m53m Expected Outcomes Short Term: Participant states understanding of desired cholesterol values and is compliant with medications prescribed. Participant is following exercise prescription and nutrition guidelines.;Long Term: Cholesterol controlled with medications as prescribed, with individualized exercise RX and with personalized nutrition plan. Value goals: LDL < 70mg22mL > 40 mg.   Stress Yes  Unable to perform yard work and household activities   Intervention Offer individual and/or small group education and counseling on adjustment to heart disease, stress management and health-related lifestyle change. Teach and support self-help strategies.;Refer participants experiencing significant psychosocial distress to appropriate mental health specialists for further evaluation and treatment. When possible, include family members and significant others in education/counseling sessions.   Expected Outcomes Short Term: Participant demonstrates changes in health-related behavior, relaxation and other stress management skills, ability to obtain effective social support, and compliance  with psychotropic medications if prescribed.;Long Term: Emotional wellbeing is indicated by absence of clinically significant psychosocial distress or social isolation.       Personal Goals Discharge:     Goals and Risk Factor Review    Row Name 09/26/16 1023 10/18/16 1327 10/30/16 1724         Core Components/Risk Factors/Patient Goals Review   Personal Goals Review Improve shortness of breath with ADL's;Other Weight Management/Obesity;Diabetes;Hypertension  -     Review LindaMonickaes she has been breathing much better adn sleeping better at night.   LindaKathreencheduled to meet with the RD.  her weight fluctuates but her BG and BP have been steady.   LindaDoreather the first time she walked around our track gym "I was about to give out that first day and quit but 2 women told me to hang in there. " LindaAjiah she has walked 48 laps now without any problems.      Expected Outcomes Sort - LindaEzmae [facility].  Long - LindaCooper be able to manage her condition successfully. Short - LindaNigeriacontinue to improve overall fitness.  Long - LindaBessye maintain her exercise and dietary habits. Heart healthy living        Exercise Goals and Review:     Exercise Goals    Row Name 08/29/16 1411             Exercise Goals   Increase Physical Activity Yes       Intervention Provide advice, education, support and counseling about physical activity/exercise needs.;Develop an individualized exercise prescription for aerobic and resistive training based on initial evaluation findings, risk stratification, comorbidities and participant's personal goals.       Expected Outcomes Achievement of increased cardiorespiratory fitness and enhanced flexibility, muscular endurance and strength shown through measurements of functional capacity and personal statement of participant.       Increase Strength and Stamina Yes       Intervention Provide advice, education, support and  counseling about physical activity/exercise needs.;Develop an individualized exercise prescription for aerobic and  resistive training based on initial evaluation findings, risk stratification, comorbidities and participant's personal goals.       Expected Outcomes Achievement of increased cardiorespiratory fitness and enhanced flexibility, muscular endurance and strength shown through measurements of functional capacity and personal statement of participant.          Nutrition & Weight - Outcomes:     Pre Biometrics - 08/29/16 1408      Pre Biometrics   Height 5' 8"  (1.727 m)   Weight 244 lb 6.4 oz (110.9 kg)   Waist Circumference 46 inches   Hip Circumference 48.5 inches   Waist to Hip Ratio 0.95 %   BMI (Calculated) 37.2         Post Biometrics - 10/21/16 1750       Post  Biometrics   Height 5' 8"  (1.727 m)   Weight 246 lb (111.6 kg)   Waist Circumference 46.5 inches   Hip Circumference 50 inches   Waist to Hip Ratio 0.93 %   BMI (Calculated) 37.41      Nutrition:   Nutrition Discharge:     Nutrition Assessments - 10/23/16 1803      MEDFICTS Scores   Post Score 6      Education Questionnaire Score:     Knowledge Questionnaire Score - 10/23/16 1802      Knowledge Questionnaire Score   Post Score 25      Goals reviewed with patient; copy given to patient.

## 2016-10-30 NOTE — Progress Notes (Signed)
Cardiac Individual Treatment Plan  Patient Details  Name: Nhi Butrum MRN: [id number] Date of Birth: May 11, 1960 Referring Provider:     Cardiac Rehab from 08/29/2016 in [facility] Cardiac and Pulmonary Rehab  Referring Provider  [name]      Initial Encounter Date:    Cardiac Rehab from 08/29/2016 in [facility] Cardiac and Pulmonary Rehab  Date  08/29/16  Referring Provider  [name]      Visit Diagnosis: Heart failure, chronic systolic ([place])  Patient's Home Medications on Admission:  Current Outpatient Prescriptions:  .  aspirin 81 MG tablet, Take 81 mg by mouth daily.  , Disp: , Rfl:  .  docusate sodium (COLACE) 100 MG capsule, Take 100 mg by mouth 2 (two) times daily.  , Disp: , Rfl:  .  enalapril (VASOTEC) 10 MG tablet, Take 10 mg by mouth daily.  , Disp: , Rfl:  .  gabapentin (NEURONTIN) 300 MG capsule, Take 300 mg by mouth., Disp: , Rfl:  .  gemfibrozil (LOPID) 600 MG tablet, Take 600 mg by mouth 2 (two) times daily before a meal.  , Disp: , Rfl:  .  hydrochlorothiazide (MICROZIDE) 12.5 MG capsule, Take 12.5 mg by mouth daily.  , Disp: , Rfl:  .  insulin aspart (NOVOLOG) 100 UNIT/ML injection, Inject 5 Units into the skin 3 (three) times daily before meals. Sliding scale, Disp: , Rfl:  .  insulin glargine (LANTUS) 100 UNIT/ML injection, Inject 35 Units into the skin at bedtime. , Disp: , Rfl:  .  lovastatin (MEVACOR) 40 MG tablet, Take 80 mg by mouth at bedtime.  , Disp: , Rfl:   Past Medical History: Past Medical History:  Diagnosis Date  . Anemia 08/30/2010   microcytic anemia  . Blood transfusion 08/2010  . Diabetes mellitus   . Headache(784.0)   . Hypercholesteremia   . Hypertension   . 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 Former Smoker  . Packs/day: 1.50  .  Years: 25.00  . Types: Cigarettes  Smokeless Tobacco  . Never Used    Comment: "quit smoking cigarettes ~ 2009"    Labs: Recent Review Flowsheet Data    Labs for ITP Cardiac and Pulmonary Rehab Latest Ref Rng & Units 08/26/2010 02/05/2011   Hemoglobin A1c <5.7 % 12.6(H) 9.0(H)       Exercise Target Goals:    Exercise Program Goal: Individual exercise prescription set with THRR, safety & activity barriers. Participant demonstrates ability to understand and report RPE using BORG scale, to self-measure pulse accurately, and to acknowledge the importance of the exercise prescription.  Exercise Prescription Goal: Starting with aerobic activity 30 plus minutes a day, 3 days per week for initial exercise prescription. Provide home exercise prescription and guidelines that participant acknowledges understanding prior to discharge.  Activity Barriers & Risk Stratification:   6 Minute Walk:     6 Minute Walk    Row Name 08/29/16 1409 10/21/16 1751       6 Minute Walk   Phase Initial Discharge    Distance 800 feet 1045 feet    Distance % Change  - 30 %    Distance Feet Change  - 245 ft    Walk Time 5 minutes 6 minutes    # of Rest Breaks 1  stopped at  5 min 0    MPH 1.8 1.97    METS 2.55 3.32    RPE 13 13    VO2 Peak 8.93 11.64    Symptoms Yes (comment) Yes (comment)    Comments back pain 10/10 back pain 6/10    Resting HR 82 bpm 94 bpm    Resting BP 132/88 128/74    Max Ex. HR 95 bpm 124 bpm    Max Ex. BP 160/78 162/74    2 Minute Post BP 126/82  -       Oxygen Initial Assessment:   Oxygen Re-Evaluation:   Oxygen Discharge (Final Oxygen Re-Evaluation):   Initial Exercise Prescription:     Initial Exercise Prescription - 08/29/16 1400      Date of Initial Exercise RX and Referring Provider   Date 08/29/16   Referring Provider [name]     Treadmill   MPH 1.8   Grade 0.5   Minutes 15  3/3/3   METs 2.5     Recumbant Bike   Level 1   RPM 50   Watts 20    Minutes 15   METs 2.5     NuStep   Level 2   SPM 80   Minutes 15   METs 2.5     Biostep-RELP   Level 2   SPM 50   Minutes 15   METs 2.5     Track   Minutes 15     Prescription Details   Frequency (times per week) 3   Duration Progress to 30 minutes of continuous aerobic without signs/symptoms of physical distress     Intensity   THRR 40-80% of Max Heartrate 116-150   Ratings of Perceived Exertion 11-13     Resistance Training   Training Prescription Yes   Weight 2   Reps 10-15      Perform Capillary Blood Glucose checks as needed.  Exercise Prescription Changes:     Exercise Prescription Changes    Row Name 08/29/16 1400 09/06/16 1000 09/18/16 1200 10/01/16 1100 10/18/16 1300     Response to Exercise   Blood Pressure (Admit) 132/88 122/82 142/78 130/86 142/70   Blood Pressure (Exercise) 160/78 138/84 138/90 142/82 144/78   Blood Pressure (Exit) 126/82 110/72 132/84 122/72 118/76   Heart Rate (Admit) 88 bpm 99 bpm 85 bpm 61 bpm 80 bpm   Heart Rate (Exercise) 78 bpm 104 bpm 89 bpm 97 bpm 94 bpm   Heart Rate (Exit) 82 bpm 88 bpm 85 bpm 86 bpm 88 bpm   Oxygen Saturation (Admit) 100 %  -  -  -  -   Oxygen Saturation (Exercise) 99 %  -  -  -  -   Rating of Perceived Exertion (Exercise) 13 15 15 12 12    Symptoms  - none none none none   Duration  - Continue with 45 min of aerobic exercise without signs/symptoms of physical distress. Continue with 45 min of aerobic exercise without signs/symptoms of physical distress. Continue with 45 min of aerobic exercise without signs/symptoms of physical distress. Continue with 45 min of aerobic exercise without signs/symptoms of physical distress.   Intensity  - THRR unchanged THRR unchanged THRR unchanged THRR unchanged     Progression   Progression  - Continue to progress workloads to maintain intensity without signs/symptoms of physical distress. Continue to progress workloads to maintain intensity without signs/symptoms of  physical distress. Continue to progress workloads to maintain intensity without signs/symptoms of physical distress. Continue  to progress workloads to maintain intensity without signs/symptoms of physical distress.   Average METs  - 3 2.2 2.22 2     Resistance Training   Training Prescription  - Yes Yes Yes Yes   Weight  - 2 lb 2 2 lbs 2 lb   Reps  - 10-15 10-15 10-15 10-15     Interval Training   Interval Training  - No No No No     NuStep   Level  -  - 2 2 3    SPM  -  - 80  -  -   Minutes  -  - 15 15 15    METs  -  - 2.4 1.9 2     Biostep-RELP   Level  - 2 2 2 2    SPM  -  - 50  -  -   Minutes  - 15 15 15 15    METs  - 3 2 2 2      Track   Laps  -  -  - 38  -   Minutes  - 15  20 laps  - 15  -   METs  -  -  - 2.76  -      Exercise Comments:     Exercise Comments    Row Name 10/30/16 1719           Exercise Comments Jacoby can walk 48 laps around the gym and the first day she couldn't do one she said "without falling out"           Exercise Goals and Review:     Exercise Goals    Row Name 08/29/16 1411             Exercise Goals   Increase Physical Activity Yes       Intervention Provide advice, education, support and counseling about physical activity/exercise needs.;Develop an individualized exercise prescription for aerobic and resistive training based on initial evaluation findings, risk stratification, comorbidities and participant's personal goals.       Expected Outcomes Achievement of increased cardiorespiratory fitness and enhanced flexibility, muscular endurance and strength shown through measurements of functional capacity and personal statement of participant.       Increase Strength and Stamina Yes       Intervention Provide advice, education, support and counseling about physical activity/exercise needs.;Develop an individualized exercise prescription for aerobic and resistive training based on initial evaluation findings, risk stratification,  comorbidities and participant's personal goals.       Expected Outcomes Achievement of increased cardiorespiratory fitness and enhanced flexibility, muscular endurance and strength shown through measurements of functional capacity and personal statement of participant.          Exercise Goals Re-Evaluation :     Exercise Goals Re-Evaluation    Row Name 09/06/16 1042 09/18/16 1223 10/01/16 1153 10/18/16 1326       Exercise Goal Re-Evaluation   Exercise Goals Review Increase Physical Activity;Increase Strenth and Stamina Increase Physical Activity;Increase Strenth and Stamina Increase Physical Activity;Increase Strenth and Stamina Increase Physical Activity;Increase Strenth and Stamina    Comments Tanice is walking the track instead of TM due to her back and prosthtetic leg.  She is tolerating exercise well. Tayra has attended regularly during her first month in HT.  She prefers walkiing the Track to TM due to her prosthesis. Kaia continues to do well in rehab.  She continues to do well walking on the track.  She is up to 38 laps  already!  We will continue to monitor her progression.  Harneet is doing strength work and stretching on her own at home.  She states she has more energy and sleeps a lot better since beginnning HT.  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    Expected Outcomes Short - Lylee will attend class regularly.  Long - Chrishawna will continue to increase her laps walked and resistance on other machines. Short - Semira will continue to attend regularly.  Long - Francy will complete HT and continue to exercise on her own.  Short: Continue to work on increasing laps and workloads.  Long: Continue to exercise independently. Short - Brandyce will complete HT.  Long - Shamarra will maintain exercise upon graduation.       Discharge Exercise Prescription (Final Exercise Prescription Changes):     Exercise Prescription Changes - 10/18/16 1300      Response to Exercise   Blood Pressure (Admit) 142/70    Blood Pressure (Exercise) 144/78   Blood Pressure (Exit) 118/76   Heart Rate (Admit) 80 bpm   Heart Rate (Exercise) 94 bpm   Heart Rate (Exit) 88 bpm   Rating of Perceived Exertion (Exercise) 12   Symptoms none   Duration Continue with 45 min of aerobic exercise without signs/symptoms of physical distress.   Intensity THRR unchanged     Progression   Progression Continue to progress workloads to maintain intensity without signs/symptoms of physical distress.   Average METs 2     Resistance Training   Training Prescription Yes   Weight 2 lb   Reps 10-15     Interval Training   Interval Training No     NuStep   Level 3   Minutes 15   METs 2     Biostep-RELP   Level 2   Minutes 15   METs 2      Nutrition:  Target Goals: Understanding of nutrition guidelines, daily intake of sodium <1544m, cholesterol <20104m calories 30% from fat and 7% or less from saturated fats, daily to have 5 or more servings of fruits and vegetables.  Biometrics:     Pre Biometrics - 08/29/16 1408      Pre Biometrics   Height 5' 8"  (1.727 m)   Weight 244 lb 6.4 oz (110.9 kg)   Waist Circumference 46 inches   Hip Circumference 48.5 inches   Waist to Hip Ratio 0.95 %   BMI (Calculated) 37.2         Post Biometrics - 10/21/16 1750       Post  Biometrics   Height 5' 8"  (1.727 m)   Weight 246 lb (111.6 kg)   Waist Circumference 46.5 inches   Hip Circumference 50 inches   Waist to Hip Ratio 0.93 %   BMI (Calculated) 37.41      Nutrition Therapy Plan and Nutrition Goals:   Nutrition Discharge: Rate Your Plate Scores:     Nutrition Assessments - 10/23/16 1803      MEDFICTS Scores   Post Score 6      Nutrition Goals Re-Evaluation:     Nutrition Goals Re-Evaluation    Row Name 10/30/16 1720             Goals   Current Weight 248 lb (112.5 kg)       Nutrition Goal Heart healthy eating. To cut portions       Expected Outcome Heart healthy eating            Nutrition Goals  Discharge (Final Nutrition Goals Re-Evaluation):     Nutrition Goals Re-Evaluation - 10/30/16 1720      Goals   Current Weight 248 lb (112.5 kg)   Nutrition Goal Heart healthy eating. To cut portions   Expected Outcome Heart healthy eating       Psychosocial: Target Goals: Acknowledge presence or absence of significant depression and/or stress, maximize coping skills, provide positive support system. Participant is able to verbalize types and ability to use techniques and skills needed for reducing stress and depression.   Initial Review & Psychosocial Screening:     Initial Psych Review & Screening - 08/29/16 1426      Initial Review   Current issues with Current Sleep Concerns  wakes up hourly to catch breath, but improving     Family Dynamics   Good Support System? Yes  Significant other, Sister and family     Barriers   Psychosocial barriers to participate in program There are no identifiable barriers or psychosocial needs.     Screening Interventions   Interventions Encouraged to exercise;Program counselor consult      Quality of Life Scores:      Quality of Life - 10/24/16 1348      Quality of Life Scores   Health/Function Post 27.2 %   Socioeconomic Post 30 %   Psych/Spiritual Post 29.14 %   Family Post 27.6 %   GLOBAL Post 28.18 %      PHQ-9: Recent Review Flowsheet Data    Depression screen [facility] 2/9 10/23/2016 08/29/2016 11/14/2010 10/09/2010   Decreased Interest 0 0 0 0   Down, Depressed, Hopeless 0 1 0 0   PHQ - 2 Score 0 1 0 0   Altered sleeping 0 1 - -   Tired, decreased energy 0 3 - -   Change in appetite 0 1 - -   Feeling bad or failure about yourself  0 1 - -   Trouble concentrating 0 0 - -   Moving slowly or fidgety/restless 0 0 - -   Suicidal thoughts 0 0 - -   PHQ-9 Score 0 7 - -   Difficult doing work/chores Not difficult at all Somewhat difficult - -     Interpretation of Total Score  Total Score Depression  Severity:  1-4 = Minimal depression, 5-9 = Mild depression, 10-14 = Moderate depression, 15-19 = Moderately severe depression, 20-27 = Severe depression   Psychosocial Evaluation and Intervention:     Psychosocial Evaluation - 09/02/16 1654      Psychosocial Evaluation & Interventions   Interventions Encouraged to exercise with the program and follow exercise prescription   Comments Counselor met with Ms. Lucita Lora) for initial psychosocial evaluation.  She turned 56 years old yesterday.  Raiza has a strong support system as she lives with her significant other; has sisters and brothers close by and she is actively involved in her faith community.  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  She reports sleeping better recently and has a good appetite.  Katrenia denies a history of depression or anxiety and states she is in a positive mood most of the time.  She has minimal stress in her life.  Felissa has goals to improve her health and increase her energy level.  She also wants to learn more about her condition and making positive healthy choices.  Staff will be following with Vaughan Basta throughout the course  of this program.     Expected Outcomes Bernarda will benefit from consistent exercise to achieve her stated goals.  She will also benefit from the educational components of this program to learn more about healthy choices.   Continue Psychosocial Services  Follow up required by staff      Psychosocial Re-Evaluation:     Psychosocial Re-Evaluation    [place] Name 10/09/16 1703 10/18/16 1329 10/23/16 1734 10/30/16 1725       Psychosocial Re-Evaluation   Current issues with  - Current Sleep Concerns  -  -    Comments Counselor follow up with Vaughan Basta today reporting feeling stronger overall; sleeping better; and enjoying the education and social aspect of this program.  She continues to struggle with chronic back pain and is  activly trying to lose weight to address this according to her Dr.'s recommendations.  Counselor commended [place] for her progress made and commitment to her health and self-care.   Keyonta states she has slept much better since beginning an exercise program. Counselor follow up with Vaughan Basta today reporting she will be graduating from this program next week and has benefitted tremendously.  She states her energy level has increased somewhat; she is able to do more with her spouse and normal activities; and she is sleeping better.  Lillieann reports she and her spouse plan to exercise together consistently in the mornings once she graduates from this program.  She admits to being skeptical when she first came in; but recognizes how positive it has been for her.  She reports the educational components have been helpful for her as well.   Counselor commended [place] on her hard work and commitment to improving her health.    -    Expected Outcomes  - Short - Anthonia will continue to have restful sleep.  Long - Joyanna will maintain overall sleep quality. Rozetta will continue to exercise consistently and reap the positive benefits of a healthier lifestyle.  Archita said today that this has helped her alot and she feels so much better.        Psychosocial Discharge (Final Psychosocial Re-Evaluation):     Psychosocial Re-Evaluation - 10/30/16 1725      Psychosocial Re-Evaluation   Expected Outcomes Kamron said today that this has helped her alot and she feels so much better.       Vocational Rehabilitation: Provide vocational rehab assistance to qualifying candidates.   Vocational Rehab Evaluation & Intervention:     Vocational Rehab - 08/29/16 1433      Initial Vocational Rehab Evaluation & Intervention   Assessment shows need for Vocational Rehabilitation No      Education: Education Goals: Education classes will be provided on a variety of topics geared toward better understanding of heart health and risk  factor modification. Participant will state understanding/return demonstration of topics presented as noted by education test scores.  Learning Barriers/Preferences:     Learning Barriers/Preferences - 08/29/16 1430      Learning Barriers/Preferences   Learning Barriers Sight;Hearing   Learning Preferences Individual Instruction      Education Topics: General Nutrition Guidelines/Fats and Fiber: -Group instruction provided by verbal, written material, models and posters to present the general guidelines for heart healthy nutrition. Gives an explanation and review of dietary fats and fiber.   Cardiac Rehab from 10/30/2016 in [facility] Cardiac and Pulmonary Rehab  Date  10/14/16  Educator  PI      Controlling Sodium/Reading Food Labels: -Group verbal and written material supporting  the discussion of sodium use in heart healthy nutrition. Review and explanation with models, verbal and written materials for utilization of the food label.   Cardiac Rehab from 10/30/2016 in [facility] Cardiac and Pulmonary Rehab  Date  10/21/16  Educator  PI  Instruction Review Code  1- Verbalizes Understanding      Exercise Physiology & Risk Factors: - Group verbal and written instruction with models to review the exercise physiology of the cardiovascular system and associated critical values. Details cardiovascular disease risk factors and the goals associated with each risk factor.   Cardiac Rehab from 10/30/2016 in [facility] Cardiac and Pulmonary Rehab  Date  09/02/16  Educator  [facility]      Aerobic Exercise & Resistance Training: - Gives group verbal and written discussion on the health impact of inactivity. On the components of aerobic and resistive training programs and the benefits of this training and how to safely progress through these programs.   Cardiac Rehab from 10/30/2016 in [facility] Cardiac and Pulmonary Rehab  Date  09/04/16  Educator  AS      Flexibility, Balance, General Exercise Guidelines: - Provides  group verbal and written instruction on the benefits of flexibility and balance training programs. Provides general exercise guidelines with specific guidelines to those with heart or lung disease. Demonstration and skill practice provided.   Cardiac Rehab from 10/30/2016 in [facility] Cardiac and Pulmonary Rehab  Date  09/09/16  Educator  AS      Stress Management: - Provides group verbal and written instruction about the health risks of elevated stress, cause of high stress, and healthy ways to reduce stress.   Cardiac Rehab from 10/30/2016 in [facility] Cardiac and Pulmonary Rehab  Date  09/18/16  Educator  [place]      Depression: - Provides group verbal and written instruction on the correlation between heart/lung disease and depressed mood, treatment options, and the stigmas associated with seeking treatment.   Cardiac Rehab from 10/30/2016 in [facility] Cardiac and Pulmonary Rehab  Date  10/16/16  Educator  [facility]  Instruction Review Code (retired)  2- meets Designer, fashion/clothing & Physiology of the Heart: - Group verbal and written instruction and models provide basic cardiac anatomy and physiology, with the coronary electrical and arterial systems. Review of: AMI, Angina, Valve disease, Heart Failure, Cardiac Arrhythmia, Pacemakers, and the ICD.   Cardiac Rehab from 10/30/2016 in [facility] Cardiac and Pulmonary Rehab  Date  09/16/16  Educator  [facility]      Cardiac Procedures: - Group verbal and written instruction to review commonly prescribed medications for heart disease. Reviews the medication, class of the drug, and side effects. Includes the steps to properly store meds and maintain the prescription regimen. (beta blockers and nitrates)   Cardiac Rehab from 10/30/2016 in [facility] Cardiac and Pulmonary Rehab  Date  09/23/16  Educator  [facility]      Cardiac Medications I: - Group verbal and written instruction to review commonly prescribed medications for heart disease. Reviews the medication, class of the drug,  and side effects. Includes the steps to properly store meds and maintain the prescription regimen.   Cardiac Rehab from 10/30/2016 in [facility] Cardiac and Pulmonary Rehab  Date  09/30/16 [part 2 10/02/16]  Educator  MJA, RN [Part 2 SB]      Cardiac Medications II: -Group verbal and written instruction to review commonly prescribed medications for heart disease. Reviews the medication, class of the drug, and side effects. (all other drug classes)  Go Sex-Intimacy & Heart Disease, Get SMART - Goal Setting: - Group verbal and written instruction through game format to discuss heart disease and the return to sexual intimacy. Provides group verbal and written material to discuss and apply goal setting through the application of the S.M.A.R.T. Method.   Cardiac Rehab from 10/30/2016 in [facility] Cardiac and Pulmonary Rehab  Date  09/23/16  Educator  [facility]      Other Matters of the Heart: - Provides group verbal, written materials and models to describe Heart Failure, Angina, Valve Disease, Peripheral Artery Disease, and Diabetes in the realm of heart disease. Includes description of the disease process and treatment options available to the cardiac patient.   Cardiac Rehab from 10/30/2016 in [facility] Cardiac and Pulmonary Rehab  Date  09/16/16  Educator  [facility]      Exercise & Equipment Safety: - Individual verbal instruction and demonstration of equipment use and safety with use of the equipment.   Cardiac Rehab from 10/30/2016 in [facility] Cardiac and Pulmonary Rehab  Date  08/29/16  Educator  SB      Infection Prevention: - Provides verbal and written material to individual with discussion of infection control including proper hand washing and proper equipment cleaning during exercise session.   Cardiac Rehab from 10/30/2016 in [facility] Cardiac and Pulmonary Rehab  Date  08/29/16  Educator  SB      Falls Prevention: - Provides verbal and written material to individual with discussion of falls prevention and  safety.   Cardiac Rehab from 10/30/2016 in [facility] Cardiac and Pulmonary Rehab  Date  08/29/16  Educator  SB  Instruction Review Code (retired)  2- meets goals/outcomes      Diabetes: - Individual verbal and written instruction to review signs/symptoms of diabetes, desired ranges of glucose level fasting, after meals and with exercise. Acknowledge that pre and post exercise glucose checks will be done for 3 sessions at entry of program.   Cardiac Rehab from 10/30/2016 in [facility]  Date  08/29/16  Educator  SB      Other: -Provides group and verbal instruction on various topics (see comments)    Knowledge Questionnaire Score:     Knowledge Questionnaire Score - 10/23/16 1802      Knowledge Questionnaire Score   Post Score 25      Core Components/Risk Factors/Patient Goals at Admission:     Personal Goals and Risk Factors at Admission - 08/29/16 1422      Core Components/Risk Factors/Patient Goals on Admission    Weight Management Obesity;Yes   Intervention Weight Management: Develop a combined nutrition and exercise program designed to reach desired caloric intake, while maintaining appropriate intake of nutrient and fiber, sodium and fats, and appropriate energy expenditure required for the weight goal.   Admit Weight 244 lb (110.7 kg)   Goal Weight: Short Term 240 lb (108.9 kg)   Goal Weight: Long Term 150 lb (68 kg)   Expected Outcomes Short Term: Continue to assess and modify interventions until short term weight is achieved;Long Term: Adherence to nutrition and physical activity/exercise program aimed toward attainment of established weight goal;Weight Loss: Understanding of general recommendations for a balanced deficit meal plan, which promotes 1-2 lb weight loss per week and includes a negative energy balance of [phone] kcal/d   Diabetes Yes   Intervention Provide education about signs/symptoms and action to take for hypo/hyperglycemia.;Provide  education about proper nutrition, including hydration, and aerobic/resistive exercise prescription along with prescribed medications to achieve  blood glucose in normal ranges: Fasting glucose 65-99 mg/dL   Expected Outcomes Short Term: Participant verbalizes understanding of the signs/symptoms and immediate care of hyper/hypoglycemia, proper foot care and importance of medication, aerobic/resistive exercise and nutrition plan for blood glucose control.;Long Term: Attainment of HbA1C < 7%.   Heart Failure Yes   Intervention Provide a combined exercise and nutrition program that is supplemented with education, support and counseling about heart failure. Directed toward relieving symptoms such as shortness of breath, decreased exercise tolerance, and extremity edema.   Expected Outcomes Improve functional capacity of life   Hypertension Yes   Intervention Provide education on lifestyle modifcations including regular physical activity/exercise, weight management, moderate sodium restriction and increased consumption of fresh fruit, vegetables, and low fat dairy, alcohol moderation, and smoking cessation.;Monitor prescription use compliance.   Expected Outcomes Short Term: Continued assessment and intervention until BP is < 140/31m HG in hypertensive participants. < 130/868mHG in hypertensive participants with diabetes, heart failure or chronic kidney disease.;Long Term: Maintenance of blood pressure at goal levels.   Lipids Yes   Intervention Provide education and support for participant on nutrition & aerobic/resistive exercise along with prescribed medications to achieve LDL <7085mHDL >34m24m Expected Outcomes Short Term: Participant states understanding of desired cholesterol values and is compliant with medications prescribed. Participant is following exercise prescription and nutrition guidelines.;Long Term: Cholesterol controlled with medications as prescribed, with individualized exercise RX and  with personalized nutrition plan. Value goals: LDL < 70mg5mL > 40 mg.   Stress Yes  Unable to perform yard work and household activities   Intervention Offer individual and/or small group education and counseling on adjustment to heart disease, stress management and health-related lifestyle change. Teach and support self-help strategies.;Refer participants experiencing significant psychosocial distress to appropriate mental health specialists for further evaluation and treatment. When possible, include family members and significant others in education/counseling sessions.   Expected Outcomes Short Term: Participant demonstrates changes in health-related behavior, relaxation and other stress management skills, ability to obtain effective social support, and compliance with psychotropic medications if prescribed.;Long Term: Emotional wellbeing is indicated by absence of clinically significant psychosocial distress or social isolation.      Core Components/Risk Factors/Patient Goals Review:      Goals and Risk Factor Review    Row Name 09/26/16 1023 10/18/16 1327 10/30/16 1724         Core Components/Risk Factors/Patient Goals Review   Personal Goals Review Improve shortness of breath with ADL's;Other Weight Management/Obesity;Diabetes;Hypertension  -     Review LindaAnettees she has been breathing much better adn sleeping better at night.   LindaZhoecheduled to meet with the RD.  her weight fluctuates but her BG and BP have been steady.   LindaStepfanie the first time she walked around our track gym "I was about to give out that first day and quit but 2 women told me to hang in there. " LindaAlanny she has walked 48 laps now without any problems.      Expected Outcomes Sort - LindaLynleigh [facility]  Long - LindaMeghen be able to manage her condition successfully. Short - LindaTrinettacontinue to improve overall fitness.  Long - LindaYatzari maintain her exercise and  dietary habits. Heart healthy living        Core Components/Risk Factors/Patient Goals at Discharge (Final Review):      Goals and Risk Factor Review - 10/30/16 1724  Core Components/Risk Factors/Patient Goals Review   Review Ionia said the first time she walked around our track gym "I was about to give out that first day and quit but 2 women told me to hang in there. " Pier said she has walked 48 laps now without any problems.    Expected Outcomes Heart healthy living      ITP Comments:     ITP Comments    Row Name 08/29/16 1415 09/11/16 0714 10/09/16 0634 10/30/16 1719     ITP Comments Med Review completed, Initial ITP created. Diagnosis can be found in Care Everywhere 5/31 30 day review. Continue with ITP unless directed changes per Medical Director review    New to program 30 day review. Continue with ITP unless directed changes per Medical Director review  Audryanna graduated today after 36 sessions.        Comments:

## 2016-10-30 NOTE — Patient Instructions (Signed)
Discharge Instructions  Patient Details  Name: Raven Watson MRN: [id number] Date of Birth: 19-Feb-1961 Referring Provider:  Marjean Donna, MD   Number of Visits: 36/36 sessions  Reason for Discharge:  Patient reached a stable level of exercise. Patient independent in their exercise. Patient has met program and personal goals.  Smoking History:  History  Smoking Status  . Former Smoker  . Packs/day: 1.50  . Years: 25.00  . Types: Cigarettes  Smokeless Tobacco  . Never Used    Comment: "quit smoking cigarettes ~ 2009"    Diagnosis:  Heart failure, chronic systolic (HCC)  Initial Exercise Prescription:     Initial Exercise Prescription - 08/29/16 1400      Date of Initial Exercise RX and Referring Provider   Date 08/29/16   Referring Provider [name]     Treadmill   MPH 1.8   Grade 0.5   Minutes 15  3/3/3   METs 2.5     Recumbant Bike   Level 1   RPM 50   Watts 20   Minutes 15   METs 2.5     NuStep   Level 2   SPM 80   Minutes 15   METs 2.5     Biostep-RELP   Level 2   SPM 50   Minutes 15   METs 2.5     Track   Minutes 15     Prescription Details   Frequency (times per week) 3   Duration Progress to 30 minutes of continuous aerobic without signs/symptoms of physical distress     Intensity   THRR 40-80% of Max Heartrate 116-150   Ratings of Perceived Exertion 11-13     Resistance Training   Training Prescription Yes   Weight 2   Reps 10-15      Discharge Exercise Prescription (Final Exercise Prescription Changes):     Exercise Prescription Changes - 10/18/16 1300      Response to Exercise   Blood Pressure (Admit) 142/70   Blood Pressure (Exercise) 144/78   Blood Pressure (Exit) 118/76   Heart Rate (Admit) 80 bpm   Heart Rate (Exercise) 94 bpm   Heart Rate (Exit) 88 bpm   Rating of Perceived Exertion (Exercise) 12   Symptoms none   Duration Continue with 45 min of aerobic exercise without signs/symptoms of physical distress.   Intensity THRR unchanged     Progression   Progression Continue to progress workloads to maintain intensity without signs/symptoms of physical distress.   Average METs 2     Resistance Training   Training Prescription Yes   Weight 2 lb   Reps 10-15     Interval Training   Interval Training No     NuStep   Level 3   Minutes 15   METs 2     Biostep-RELP   Level 2   Minutes 15   METs 2      Functional Capacity:     6 Minute Walk    Row Name 08/29/16 1409 10/21/16 1751       6 Minute Walk   Phase Initial Discharge    Distance 800 feet 1045 feet    Distance % Change  - 30 %    Distance Feet Change  - 245 ft    Walk Time 5 minutes 6 minutes    # of Rest Breaks 1  stopped at 5 min 0    MPH 1.8 1.97    METS 2.55 3.32  RPE 13 13    VO2 Peak 8.93 11.64    Symptoms Yes (comment) Yes (comment)    Comments back pain 10/10 back pain 6/10    Resting HR 82 bpm 94 bpm    Resting BP 132/88 128/74    Max Ex. HR 95 bpm 124 bpm    Max Ex. BP 160/78 162/74    2 Minute Post BP 126/82  -       Quality of Life:     Quality of Life - 10/24/16 1348      Quality of Life Scores   Health/Function Post 27.2 %   Socioeconomic Post 30 %   Psych/Spiritual Post 29.14 %   Family Post 27.6 %   GLOBAL Post 28.18 %      Personal Goals: Goals established at orientation with interventions provided to work toward goal.     Personal Goals and Risk Factors at Admission - 08/29/16 1422      Core Components/Risk Factors/Patient Goals on Admission    Weight Management Obesity;Yes   Intervention Weight Management: Develop a combined nutrition and exercise program designed to reach desired caloric intake, while maintaining appropriate intake of nutrient and fiber, sodium and fats, and appropriate energy expenditure required for the weight goal.   Admit Weight 244 lb (110.7 kg)   Goal Weight: Short Term 240 lb (108.9 kg)   Goal Weight: Long Term 150 lb (68 kg)   Expected Outcomes  Short Term: Continue to assess and modify interventions until short term weight is achieved;Long Term: Adherence to nutrition and physical activity/exercise program aimed toward attainment of established weight goal;Weight Loss: Understanding of general recommendations for a balanced deficit meal plan, which promotes 1-2 lb weight loss per week and includes a negative energy balance of [phone] kcal/d   Diabetes Yes   Intervention Provide education about signs/symptoms and action to take for hypo/hyperglycemia.;Provide education about proper nutrition, including hydration, and aerobic/resistive exercise prescription along with prescribed medications to achieve blood glucose in normal ranges: Fasting glucose 65-99 mg/dL   Expected Outcomes Short Term: Participant verbalizes understanding of the signs/symptoms and immediate care of hyper/hypoglycemia, proper foot care and importance of medication, aerobic/resistive exercise and nutrition plan for blood glucose control.;Long Term: Attainment of HbA1C < 7%.   Heart Failure Yes   Intervention Provide a combined exercise and nutrition program that is supplemented with education, support and counseling about heart failure. Directed toward relieving symptoms such as shortness of breath, decreased exercise tolerance, and extremity edema.   Expected Outcomes Improve functional capacity of life   Hypertension Yes   Intervention Provide education on lifestyle modifcations including regular physical activity/exercise, weight management, moderate sodium restriction and increased consumption of fresh fruit, vegetables, and low fat dairy, alcohol moderation, and smoking cessation.;Monitor prescription use compliance.   Expected Outcomes Short Term: Continued assessment and intervention until BP is < 140/98m HG in hypertensive participants. < 130/867mHG in hypertensive participants with diabetes, heart failure or chronic kidney disease.;Long Term: Maintenance of blood  pressure at goal levels.   Lipids Yes   Intervention Provide education and support for participant on nutrition & aerobic/resistive exercise along with prescribed medications to achieve LDL <7069mHDL >91m42m Expected Outcomes Short Term: Participant states understanding of desired cholesterol values and is compliant with medications prescribed. Participant is following exercise prescription and nutrition guidelines.;Long Term: Cholesterol controlled with medications as prescribed, with individualized exercise RX and with personalized nutrition plan. Value goals: LDL < 70mg16mL > 40 mg.  Stress Yes  Unable to perform yard work and household activities   Intervention Offer individual and/or small group education and counseling on adjustment to heart disease, stress management and health-related lifestyle change. Teach and support self-help strategies.;Refer participants experiencing significant psychosocial distress to appropriate mental health specialists for further evaluation and treatment. When possible, include family members and significant others in education/counseling sessions.   Expected Outcomes Short Term: Participant demonstrates changes in health-related behavior, relaxation and other stress management skills, ability to obtain effective social support, and compliance with psychotropic medications if prescribed.;Long Term: Emotional wellbeing is indicated by absence of clinically significant psychosocial distress or social isolation.       Personal Goals Discharge:     Goals and Risk Factor Review - 10/30/16 1724      Core Components/Risk Factors/Patient Goals Review   Review Shaylee said the first time she walked around our track gym "I was about to give out that first day and quit but 2 women told me to hang in there. " Signe said she has walked 48 laps now without any problems.    Expected Outcomes Heart healthy living      Exercise Goals and Review:     Exercise Goals     Row Name 08/29/16 1411             Exercise Goals   Increase Physical Activity Yes       Intervention Provide advice, education, support and counseling about physical activity/exercise needs.;Develop an individualized exercise prescription for aerobic and resistive training based on initial evaluation findings, risk stratification, comorbidities and participant's personal goals.       Expected Outcomes Achievement of increased cardiorespiratory fitness and enhanced flexibility, muscular endurance and strength shown through measurements of functional capacity and personal statement of participant.       Increase Strength and Stamina Yes       Intervention Provide advice, education, support and counseling about physical activity/exercise needs.;Develop an individualized exercise prescription for aerobic and resistive training based on initial evaluation findings, risk stratification, comorbidities and participant's personal goals.       Expected Outcomes Achievement of increased cardiorespiratory fitness and enhanced flexibility, muscular endurance and strength shown through measurements of functional capacity and personal statement of participant.          Nutrition & Weight - Outcomes:     Pre Biometrics - 08/29/16 1408      Pre Biometrics   Height 5' 8"  (1.727 m)   Weight 244 lb 6.4 oz (110.9 kg)   Waist Circumference 46 inches   Hip Circumference 48.5 inches   Waist to Hip Ratio 0.95 %   BMI (Calculated) 37.2         Post Biometrics - 10/21/16 1750       Post  Biometrics   Height 5' 8"  (1.727 m)   Weight 246 lb (111.6 kg)   Waist Circumference 46.5 inches   Hip Circumference 50 inches   Waist to Hip Ratio 0.93 %   BMI (Calculated) 37.41      Nutrition:   Nutrition Discharge:     Nutrition Assessments - 10/23/16 1803      MEDFICTS Scores   Post Score 6      Education Questionnaire Score:     Knowledge Questionnaire Score - 10/23/16 1802      Knowledge  Questionnaire Score   Post Score 25      Goals reviewed with patient; copy given to patient.

## 2016-10-30 NOTE — Progress Notes (Signed)
Cardiac Individual Treatment Plan  Patient Details  Name: Raven Watson MRN: [id number] Date of Birth: Jul 01, 1960 Referring Provider:     Cardiac Rehab from 08/29/2016 in [facility] Cardiac and Pulmonary Rehab  Referring Provider  [name]      Initial Encounter Date:    Cardiac Rehab from 08/29/2016 in [facility] Cardiac and Pulmonary Rehab  Date  08/29/16  Referring Provider  [name]      Visit Diagnosis: Heart failure, chronic systolic ([place])  Patient's Home Medications on Admission:  Current Outpatient Prescriptions:  .  aspirin 81 MG tablet, Take 81 mg by mouth daily.  , Disp: , Rfl:  .  docusate sodium (COLACE) 100 MG capsule, Take 100 mg by mouth 2 (two) times daily.  , Disp: , Rfl:  .  enalapril (VASOTEC) 10 MG tablet, Take 10 mg by mouth daily.  , Disp: , Rfl:  .  gabapentin (NEURONTIN) 300 MG capsule, Take 300 mg by mouth., Disp: , Rfl:  .  gemfibrozil (LOPID) 600 MG tablet, Take 600 mg by mouth 2 (two) times daily before a meal.  , Disp: , Rfl:  .  hydrochlorothiazide (MICROZIDE) 12.5 MG capsule, Take 12.5 mg by mouth daily.  , Disp: , Rfl:  .  insulin aspart (NOVOLOG) 100 UNIT/ML injection, Inject 5 Units into the skin 3 (three) times daily before meals. Sliding scale, Disp: , Rfl:  .  insulin glargine (LANTUS) 100 UNIT/ML injection, Inject 35 Units into the skin at bedtime. , Disp: , Rfl:  .  lovastatin (MEVACOR) 40 MG tablet, Take 80 mg by mouth at bedtime.  , Disp: , Rfl:   Past Medical History: Past Medical History:  Diagnosis Date  . Anemia 08/30/2010   microcytic anemia  . Blood transfusion 08/2010  . Diabetes mellitus   . Headache(784.0)   . Hypercholesteremia   . Hypertension   . 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 Former Smoker  . Packs/day: 1.50  .  Years: 25.00  . Types: Cigarettes  Smokeless Tobacco  . Never Used    Comment: "quit smoking cigarettes ~ 2009"    Labs: Recent Review Flowsheet Data    Labs for ITP Cardiac and Pulmonary Rehab Latest Ref Rng & Units 08/26/2010 02/05/2011   Hemoglobin A1c <5.7 % 12.6(H) 9.0(H)       Exercise Target Goals:    Exercise Program Goal: Individual exercise prescription set with THRR, safety & activity barriers. Participant demonstrates ability to understand and report RPE using BORG scale, to self-measure pulse accurately, and to acknowledge the importance of the exercise prescription.  Exercise Prescription Goal: Starting with aerobic activity 30 plus minutes a day, 3 days per week for initial exercise prescription. Provide home exercise prescription and guidelines that participant acknowledges understanding prior to discharge.  Activity Barriers & Risk Stratification:   6 Minute Walk:     6 Minute Walk    Row Name 08/29/16 1409 10/21/16 1751       6 Minute Walk   Phase Initial Discharge    Distance 800 feet 1045 feet    Distance % Change  - 30 %    Distance Feet Change  - 245 ft    Walk Time 5 minutes 6 minutes    # of Rest Breaks 1  stopped at  5 min 0    MPH 1.8 1.97    METS 2.55 3.32    RPE 13 13    VO2 Peak 8.93 11.64    Symptoms Yes (comment) Yes (comment)    Comments back pain 10/10 back pain 6/10    Resting HR 82 bpm 94 bpm    Resting BP 132/88 128/74    Max Ex. HR 95 bpm 124 bpm    Max Ex. BP 160/78 162/74    2 Minute Post BP 126/82  -       Oxygen Initial Assessment:   Oxygen Re-Evaluation:   Oxygen Discharge (Final Oxygen Re-Evaluation):   Initial Exercise Prescription:     Initial Exercise Prescription - 08/29/16 1400      Date of Initial Exercise RX and Referring Provider   Date 08/29/16   Referring Provider [name]     Treadmill   MPH 1.8   Grade 0.5   Minutes 15  3/3/3   METs 2.5     Recumbant Bike   Level 1   RPM 50   Watts 20    Minutes 15   METs 2.5     NuStep   Level 2   SPM 80   Minutes 15   METs 2.5     Biostep-RELP   Level 2   SPM 50   Minutes 15   METs 2.5     Track   Minutes 15     Prescription Details   Frequency (times per week) 3   Duration Progress to 30 minutes of continuous aerobic without signs/symptoms of physical distress     Intensity   THRR 40-80% of Max Heartrate 116-150   Ratings of Perceived Exertion 11-13     Resistance Training   Training Prescription Yes   Weight 2   Reps 10-15      Perform Capillary Blood Glucose checks as needed.  Exercise Prescription Changes:     Exercise Prescription Changes    Row Name 08/29/16 1400 09/06/16 1000 09/18/16 1200 10/01/16 1100 10/18/16 1300     Response to Exercise   Blood Pressure (Admit) 132/88 122/82 142/78 130/86 142/70   Blood Pressure (Exercise) 160/78 138/84 138/90 142/82 144/78   Blood Pressure (Exit) 126/82 110/72 132/84 122/72 118/76   Heart Rate (Admit) 88 bpm 99 bpm 85 bpm 61 bpm 80 bpm   Heart Rate (Exercise) 78 bpm 104 bpm 89 bpm 97 bpm 94 bpm   Heart Rate (Exit) 82 bpm 88 bpm 85 bpm 86 bpm 88 bpm   Oxygen Saturation (Admit) 100 %  -  -  -  -   Oxygen Saturation (Exercise) 99 %  -  -  -  -   Rating of Perceived Exertion (Exercise) 13 15 15 12 12    Symptoms  - none none none none   Duration  - Continue with 45 min of aerobic exercise without signs/symptoms of physical distress. Continue with 45 min of aerobic exercise without signs/symptoms of physical distress. Continue with 45 min of aerobic exercise without signs/symptoms of physical distress. Continue with 45 min of aerobic exercise without signs/symptoms of physical distress.   Intensity  - THRR unchanged THRR unchanged THRR unchanged THRR unchanged     Progression   Progression  - Continue to progress workloads to maintain intensity without signs/symptoms of physical distress. Continue to progress workloads to maintain intensity without signs/symptoms of  physical distress. Continue to progress workloads to maintain intensity without signs/symptoms of physical distress. Continue  to progress workloads to maintain intensity without signs/symptoms of physical distress.   Average METs  - 3 2.2 2.22 2     Resistance Training   Training Prescription  - Yes Yes Yes Yes   Weight  - 2 lb 2 2 lbs 2 lb   Reps  - 10-15 10-15 10-15 10-15     Interval Training   Interval Training  - No No No No     NuStep   Level  -  - 2 2 3    SPM  -  - 80  -  -   Minutes  -  - 15 15 15    METs  -  - 2.4 1.9 2     Biostep-RELP   Level  - 2 2 2 2    SPM  -  - 50  -  -   Minutes  - 15 15 15 15    METs  - 3 2 2 2      Track   Laps  -  -  - 38  -   Minutes  - 15  20 laps  - 15  -   METs  -  -  - 2.76  -      Exercise Comments:     Exercise Comments    Row Name 10/30/16 1719           Exercise Comments Raven Watson can walk 48 laps around the gym and the first day she couldn't do one she said "without falling out"           Exercise Goals and Review:     Exercise Goals    Row Name 08/29/16 1411             Exercise Goals   Increase Physical Activity Yes       Intervention Provide advice, education, support and counseling about physical activity/exercise needs.;Develop an individualized exercise prescription for aerobic and resistive training based on initial evaluation findings, risk stratification, comorbidities and participant's personal goals.       Expected Outcomes Achievement of increased cardiorespiratory fitness and enhanced flexibility, muscular endurance and strength shown through measurements of functional capacity and personal statement of participant.       Increase Strength and Stamina Yes       Intervention Provide advice, education, support and counseling about physical activity/exercise needs.;Develop an individualized exercise prescription for aerobic and resistive training based on initial evaluation findings, risk stratification,  comorbidities and participant's personal goals.       Expected Outcomes Achievement of increased cardiorespiratory fitness and enhanced flexibility, muscular endurance and strength shown through measurements of functional capacity and personal statement of participant.          Exercise Goals Re-Evaluation :     Exercise Goals Re-Evaluation    Row Name 09/06/16 1042 09/18/16 1223 10/01/16 1153 10/18/16 1326       Exercise Goal Re-Evaluation   Exercise Goals Review Increase Physical Activity;Increase Strenth and Stamina Increase Physical Activity;Increase Strenth and Stamina Increase Physical Activity;Increase Strenth and Stamina Increase Physical Activity;Increase Strenth and Stamina    Comments Raven Watson is walking the track instead of TM due to her back and prosthtetic leg.  She is tolerating exercise well. Raven Watson has attended regularly during her first month in HT.  She prefers walkiing the Track to TM due to her prosthesis. Raven Watson continues to do well in rehab.  She continues to do well walking on the track.  She is up to 38 laps  already!  We will continue to monitor her progression.  Raven Watson is doing strength work and stretching on her own at home.  She states she has more energy and sleeps a lot better since beginnning HT.  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    Expected Outcomes Short - Raven Watson will attend class regularly.  Long - Akane will continue to increase her laps walked and resistance on other machines. Short - Raven Watson will continue to attend regularly.  Long - Raven Watson will complete HT and continue to exercise on her own.  Short: Continue to work on increasing laps and workloads.  Long: Continue to exercise independently. Short - Raven Watson will complete HT.  Long - Raven Watson will maintain exercise upon graduation.       Discharge Exercise Prescription (Final Exercise Prescription Changes):     Exercise Prescription Changes - 10/18/16 1300      Response to Exercise   Blood Pressure (Admit) 142/70    Blood Pressure (Exercise) 144/78   Blood Pressure (Exit) 118/76   Heart Rate (Admit) 80 bpm   Heart Rate (Exercise) 94 bpm   Heart Rate (Exit) 88 bpm   Rating of Perceived Exertion (Exercise) 12   Symptoms none   Duration Continue with 45 min of aerobic exercise without signs/symptoms of physical distress.   Intensity THRR unchanged     Progression   Progression Continue to progress workloads to maintain intensity without signs/symptoms of physical distress.   Average METs 2     Resistance Training   Training Prescription Yes   Weight 2 lb   Reps 10-15     Interval Training   Interval Training No     NuStep   Level 3   Minutes 15   METs 2     Biostep-RELP   Level 2   Minutes 15   METs 2      Nutrition:  Target Goals: Understanding of nutrition guidelines, daily intake of sodium <1549m, cholesterol <2051m calories 30% from fat and 7% or less from saturated fats, daily to have 5 or more servings of fruits and vegetables.  Biometrics:     Pre Biometrics - 08/29/16 1408      Pre Biometrics   Height 5' 8"  (1.727 m)   Weight 244 lb 6.4 oz (110.9 kg)   Waist Circumference 46 inches   Hip Circumference 48.5 inches   Waist to Hip Ratio 0.95 %   BMI (Calculated) 37.2         Post Biometrics - 10/21/16 1750       Post  Biometrics   Height 5' 8"  (1.727 m)   Weight 246 lb (111.6 kg)   Waist Circumference 46.5 inches   Hip Circumference 50 inches   Waist to Hip Ratio 0.93 %   BMI (Calculated) 37.41      Nutrition Therapy Plan and Nutrition Goals:   Nutrition Discharge: Rate Your Plate Scores:     Nutrition Assessments - 10/23/16 1803      MEDFICTS Scores   Post Score 6      Nutrition Goals Re-Evaluation:     Nutrition Goals Re-Evaluation    Row Name 10/30/16 1720             Goals   Current Weight 248 lb (112.5 kg)       Nutrition Goal Heart healthy eating. To cut portions       Expected Outcome Heart healthy eating            Nutrition Goals  Discharge (Final Nutrition Goals Re-Evaluation):     Nutrition Goals Re-Evaluation - 10/30/16 1720      Goals   Current Weight 248 lb (112.5 kg)   Nutrition Goal Heart healthy eating. To cut portions   Expected Outcome Heart healthy eating       Psychosocial: Target Goals: Acknowledge presence or absence of significant depression and/or stress, maximize coping skills, provide positive support system. Participant is able to verbalize types and ability to use techniques and skills needed for reducing stress and depression.   Initial Review & Psychosocial Screening:     Initial Psych Review & Screening - 08/29/16 1426      Initial Review   Current issues with Current Sleep Concerns  wakes up hourly to catch breath, but improving     Family Dynamics   Good Support System? Yes  Significant other, Sister and family     Barriers   Psychosocial barriers to participate in program There are no identifiable barriers or psychosocial needs.     Screening Interventions   Interventions Encouraged to exercise;Program counselor consult      Quality of Life Scores:      Quality of Life - 10/24/16 1348      Quality of Life Scores   Health/Function Post 27.2 %   Socioeconomic Post 30 %   Psych/Spiritual Post 29.14 %   Family Post 27.6 %   GLOBAL Post 28.18 %      PHQ-9: Recent Review Flowsheet Data    Depression screen [facility] 2/9 10/23/2016 08/29/2016 11/14/2010 10/09/2010   Decreased Interest 0 0 0 0   Down, Depressed, Hopeless 0 1 0 0   PHQ - 2 Score 0 1 0 0   Altered sleeping 0 1 - -   Tired, decreased energy 0 3 - -   Change in appetite 0 1 - -   Feeling bad or failure about yourself  0 1 - -   Trouble concentrating 0 0 - -   Moving slowly or fidgety/restless 0 0 - -   Suicidal thoughts 0 0 - -   PHQ-9 Score 0 7 - -   Difficult doing work/chores Not difficult at all Somewhat difficult - -     Interpretation of Total Score  Total Score Depression  Severity:  1-4 = Minimal depression, 5-9 = Mild depression, 10-14 = Moderate depression, 15-19 = Moderately severe depression, 20-27 = Severe depression   Psychosocial Evaluation and Intervention:     Psychosocial Evaluation - 09/02/16 1654      Psychosocial Evaluation & Interventions   Interventions Encouraged to exercise with the program and follow exercise prescription   Comments Counselor met with Ms. Raven Watson) for initial psychosocial evaluation.  She turned 56 years old yesterday.  Bellarae has a strong support system as she lives with her significant other; has sisters and brothers close by and she is actively involved in her faith community.  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  She reports sleeping better recently and has a good appetite.  Erina denies a history of depression or anxiety and states she is in a positive mood most of the time.  She has minimal stress in her life.  Genowefa has goals to improve her health and increase her energy level.  She also wants to learn more about her condition and making positive healthy choices.  Staff will be following with Raven Watson throughout the course  of this program.     Expected Outcomes Via will benefit from consistent exercise to achieve her stated goals.  She will also benefit from the educational components of this program to learn more about healthy choices.   Continue Psychosocial Services  Follow up required by staff      Psychosocial Re-Evaluation:     Psychosocial Re-Evaluation    [place] Name 10/09/16 1703 10/18/16 1329 10/23/16 1734 10/30/16 1725       Psychosocial Re-Evaluation   Current issues with  - Current Sleep Concerns  -  -    Comments Counselor follow up with Raven Watson today reporting feeling stronger overall; sleeping better; and enjoying the education and social aspect of this program.  She continues to struggle with chronic back pain and is  activly trying to lose weight to address this according to her Dr.'s recommendations.  Counselor commended [place] for her progress made and commitment to her health and self-care.   Johannah states she has slept much better since beginning an exercise program. Counselor follow up with Raven Watson today reporting she will be graduating from this program next week and has benefitted tremendously.  She states her energy level has increased somewhat; she is able to do more with her spouse and normal activities; and she is sleeping better.  Ghadeer reports she and her spouse plan to exercise together consistently in the mornings once she graduates from this program.  She admits to being skeptical when she first came in; but recognizes how positive it has been for her.  She reports the educational components have been helpful for her as well.   Counselor commended [place] on her hard work and commitment to improving her health.    -    Expected Outcomes  - Short - Raven Watson will continue to have restful sleep.  Long - Raven Watson will maintain overall sleep quality. Raven Watson will continue to exercise consistently and reap the positive benefits of a healthier lifestyle.  Raven Watson said today that this has helped her alot and she feels so much better.        Psychosocial Discharge (Final Psychosocial Re-Evaluation):     Psychosocial Re-Evaluation - 10/30/16 1725      Psychosocial Re-Evaluation   Expected Outcomes Yanin said today that this has helped her alot and she feels so much better.       Vocational Rehabilitation: Provide vocational rehab assistance to qualifying candidates.   Vocational Rehab Evaluation & Intervention:     Vocational Rehab - 08/29/16 1433      Initial Vocational Rehab Evaluation & Intervention   Assessment shows need for Vocational Rehabilitation No      Education: Education Goals: Education classes will be provided on a variety of topics geared toward better understanding of heart health and risk  factor modification. Participant will state understanding/return demonstration of topics presented as noted by education test scores.  Learning Barriers/Preferences:     Learning Barriers/Preferences - 08/29/16 1430      Learning Barriers/Preferences   Learning Barriers Sight;Hearing   Learning Preferences Individual Instruction      Education Topics: General Nutrition Guidelines/Fats and Fiber: -Group instruction provided by verbal, written material, models and posters to present the general guidelines for heart healthy nutrition. Gives an explanation and review of dietary fats and fiber.   Cardiac Rehab from 10/30/2016 in [facility] Cardiac and Pulmonary Rehab  Date  10/14/16  Educator  PI      Controlling Sodium/Reading Food Labels: -Group verbal and written material supporting  the discussion of sodium use in heart healthy nutrition. Review and explanation with models, verbal and written materials for utilization of the food label.   Cardiac Rehab from 10/30/2016 in [facility] Cardiac and Pulmonary Rehab  Date  10/21/16  Educator  PI  Instruction Review Code  1- Verbalizes Understanding      Exercise Physiology & Risk Factors: - Group verbal and written instruction with models to review the exercise physiology of the cardiovascular system and associated critical values. Details cardiovascular disease risk factors and the goals associated with each risk factor.   Cardiac Rehab from 10/30/2016 in [facility] Cardiac and Pulmonary Rehab  Date  09/02/16  Educator  [facility]      Aerobic Exercise & Resistance Training: - Gives group verbal and written discussion on the health impact of inactivity. On the components of aerobic and resistive training programs and the benefits of this training and how to safely progress through these programs.   Cardiac Rehab from 10/30/2016 in [facility] Cardiac and Pulmonary Rehab  Date  09/04/16  Educator  AS      Flexibility, Balance, General Exercise Guidelines: - Provides  group verbal and written instruction on the benefits of flexibility and balance training programs. Provides general exercise guidelines with specific guidelines to those with heart or lung disease. Demonstration and skill practice provided.   Cardiac Rehab from 10/30/2016 in [facility] Cardiac and Pulmonary Rehab  Date  09/09/16  Educator  AS      Stress Management: - Provides group verbal and written instruction about the health risks of elevated stress, cause of high stress, and healthy ways to reduce stress.   Cardiac Rehab from 10/30/2016 in [facility] Cardiac and Pulmonary Rehab  Date  09/18/16  Educator  [place]      Depression: - Provides group verbal and written instruction on the correlation between heart/lung disease and depressed mood, treatment options, and the stigmas associated with seeking treatment.   Cardiac Rehab from 10/30/2016 in [facility] Cardiac and Pulmonary Rehab  Date  10/16/16  Educator  [facility]  Instruction Review Code (retired)  2- meets Designer, fashion/clothing & Physiology of the Heart: - Group verbal and written instruction and models provide basic cardiac anatomy and physiology, with the coronary electrical and arterial systems. Review of: AMI, Angina, Valve disease, Heart Failure, Cardiac Arrhythmia, Pacemakers, and the ICD.   Cardiac Rehab from 10/30/2016 in [facility] Cardiac and Pulmonary Rehab  Date  09/16/16  Educator  [facility]      Cardiac Procedures: - Group verbal and written instruction to review commonly prescribed medications for heart disease. Reviews the medication, class of the drug, and side effects. Includes the steps to properly store meds and maintain the prescription regimen. (beta blockers and nitrates)   Cardiac Rehab from 10/30/2016 in [facility] Cardiac and Pulmonary Rehab  Date  09/23/16  Educator  [facility]      Cardiac Medications I: - Group verbal and written instruction to review commonly prescribed medications for heart disease. Reviews the medication, class of the drug,  and side effects. Includes the steps to properly store meds and maintain the prescription regimen.   Cardiac Rehab from 10/30/2016 in [facility] Cardiac and Pulmonary Rehab  Date  09/30/16 [part 2 10/02/16]  Educator  MJA, RN [Part 2 SB]      Cardiac Medications II: -Group verbal and written instruction to review commonly prescribed medications for heart disease. Reviews the medication, class of the drug, and side effects. (all other drug classes)  Go Sex-Intimacy & Heart Disease, Get SMART - Goal Setting: - Group verbal and written instruction through game format to discuss heart disease and the return to sexual intimacy. Provides group verbal and written material to discuss and apply goal setting through the application of the S.M.A.R.T. Method.   Cardiac Rehab from 10/30/2016 in [facility] Cardiac and Pulmonary Rehab  Date  09/23/16  Educator  [facility]      Other Matters of the Heart: - Provides group verbal, written materials and models to describe Heart Failure, Angina, Valve Disease, Peripheral Artery Disease, and Diabetes in the realm of heart disease. Includes description of the disease process and treatment options available to the cardiac patient.   Cardiac Rehab from 10/30/2016 in [facility] Cardiac and Pulmonary Rehab  Date  09/16/16  Educator  [facility]      Exercise & Equipment Safety: - Individual verbal instruction and demonstration of equipment use and safety with use of the equipment.   Cardiac Rehab from 10/30/2016 in [facility] Cardiac and Pulmonary Rehab  Date  08/29/16  Educator  SB      Infection Prevention: - Provides verbal and written material to individual with discussion of infection control including proper hand washing and proper equipment cleaning during exercise session.   Cardiac Rehab from 10/30/2016 in [facility] Cardiac and Pulmonary Rehab  Date  08/29/16  Educator  SB      Falls Prevention: - Provides verbal and written material to individual with discussion of falls prevention and  safety.   Cardiac Rehab from 10/30/2016 in [facility] Cardiac and Pulmonary Rehab  Date  08/29/16  Educator  SB  Instruction Review Code (retired)  2- meets goals/outcomes      Diabetes: - Individual verbal and written instruction to review signs/symptoms of diabetes, desired ranges of glucose level fasting, after meals and with exercise. Acknowledge that pre and post exercise glucose checks will be done for 3 sessions at entry of program.   Cardiac Rehab from 10/30/2016 in [facility]  Date  08/29/16  Educator  SB      Other: -Provides group and verbal instruction on various topics (see comments)    Knowledge Questionnaire Score:     Knowledge Questionnaire Score - 10/23/16 1802      Knowledge Questionnaire Score   Post Score 25      Core Components/Risk Factors/Patient Goals at Admission:     Personal Goals and Risk Factors at Admission - 08/29/16 1422      Core Components/Risk Factors/Patient Goals on Admission    Weight Management Obesity;Yes   Intervention Weight Management: Develop a combined nutrition and exercise program designed to reach desired caloric intake, while maintaining appropriate intake of nutrient and fiber, sodium and fats, and appropriate energy expenditure required for the weight goal.   Admit Weight 244 lb (110.7 kg)   Goal Weight: Short Term 240 lb (108.9 kg)   Goal Weight: Long Term 150 lb (68 kg)   Expected Outcomes Short Term: Continue to assess and modify interventions until short term weight is achieved;Long Term: Adherence to nutrition and physical activity/exercise program aimed toward attainment of established weight goal;Weight Loss: Understanding of general recommendations for a balanced deficit meal plan, which promotes 1-2 lb weight loss per week and includes a negative energy balance of [phone] kcal/d   Diabetes Yes   Intervention Provide education about signs/symptoms and action to take for hypo/hyperglycemia.;Provide  education about proper nutrition, including hydration, and aerobic/resistive exercise prescription along with prescribed medications to achieve  blood glucose in normal ranges: Fasting glucose 65-99 mg/dL   Expected Outcomes Short Term: Participant verbalizes understanding of the signs/symptoms and immediate care of hyper/hypoglycemia, proper foot care and importance of medication, aerobic/resistive exercise and nutrition plan for blood glucose control.;Long Term: Attainment of HbA1C < 7%.   Heart Failure Yes   Intervention Provide a combined exercise and nutrition program that is supplemented with education, support and counseling about heart failure. Directed toward relieving symptoms such as shortness of breath, decreased exercise tolerance, and extremity edema.   Expected Outcomes Improve functional capacity of life   Hypertension Yes   Intervention Provide education on lifestyle modifcations including regular physical activity/exercise, weight management, moderate sodium restriction and increased consumption of fresh fruit, vegetables, and low fat dairy, alcohol moderation, and smoking cessation.;Monitor prescription use compliance.   Expected Outcomes Short Term: Continued assessment and intervention until BP is < 140/61m HG in hypertensive participants. < 130/894mHG in hypertensive participants with diabetes, heart failure or chronic kidney disease.;Long Term: Maintenance of blood pressure at goal levels.   Lipids Yes   Intervention Provide education and support for participant on nutrition & aerobic/resistive exercise along with prescribed medications to achieve LDL <7047mHDL >70m15m Expected Outcomes Short Term: Participant states understanding of desired cholesterol values and is compliant with medications prescribed. Participant is following exercise prescription and nutrition guidelines.;Long Term: Cholesterol controlled with medications as prescribed, with individualized exercise RX and  with personalized nutrition plan. Value goals: LDL < 70mg43mL > 40 mg.   Stress Yes  Unable to perform yard work and household activities   Intervention Offer individual and/or small group education and counseling on adjustment to heart disease, stress management and health-related lifestyle change. Teach and support self-help strategies.;Refer participants experiencing significant psychosocial distress to appropriate mental health specialists for further evaluation and treatment. When possible, include family members and significant others in education/counseling sessions.   Expected Outcomes Short Term: Participant demonstrates changes in health-related behavior, relaxation and other stress management skills, ability to obtain effective social support, and compliance with psychotropic medications if prescribed.;Long Term: Emotional wellbeing is indicated by absence of clinically significant psychosocial distress or social isolation.      Core Components/Risk Factors/Patient Goals Review:      Goals and Risk Factor Review    Row Name 09/26/16 1023 10/18/16 1327 10/30/16 1724         Core Components/Risk Factors/Patient Goals Review   Personal Goals Review Improve shortness of breath with ADL's;Other Weight Management/Obesity;Diabetes;Hypertension  -     Review Raven Watson she has been breathing much better adn sleeping better at night.   Raven Watson to meet with the RD.  her weight fluctuates but her BG and BP have been steady.   Raven Watson the first time she walked around our track gym "I was about to give out that first day and quit but 2 women told me to hang in there. " Raven Watson she has walked 48 laps now without any problems.      Expected Outcomes Sort - Raven Watson [facility]  Long - Raven Watson be able to manage her condition successfully. Short - LindaAdelheidcontinue to improve overall fitness.  Long - LindaJackie maintain her exercise and  dietary habits. Heart healthy living        Core Components/Risk Factors/Patient Goals at Discharge (Final Review):      Goals and Risk Factor Review - 10/30/16 1724  Core Components/Risk Factors/Patient Goals Review   Review Amirra said the first time she walked around our track gym "I was about to give out that first day and quit but 2 women told me to hang in there. " Rielly said she has walked 48 laps now without any problems.    Expected Outcomes Heart healthy living      ITP Comments:     ITP Comments    Row Name 08/29/16 1415 09/11/16 0714 10/09/16 0634 10/30/16 1719     ITP Comments Med Review completed, Initial ITP created. Diagnosis can be found in Care Everywhere 5/31 30 day review. Continue with ITP unless directed changes per Medical Director review    New to program 30 day review. Continue with ITP unless directed changes per Medical Director review  Shonnie graduated today after 36 sessions.        Comments: Graduated today after 36/36 sessions.

## 2017-11-05 ENCOUNTER — Ambulatory Visit: Payer: Medicare Other | Attending: Neurology

## 2017-11-05 DIAGNOSIS — G4733 Obstructive sleep apnea (adult) (pediatric): Secondary | ICD-10-CM | POA: Insufficient documentation

## 2017-11-05 DIAGNOSIS — F5101 Primary insomnia: Secondary | ICD-10-CM | POA: Insufficient documentation

## 2017-11-05 DIAGNOSIS — Z9189 Other specified personal risk factors, not elsewhere classified: Secondary | ICD-10-CM | POA: Diagnosis present

## 2017-11-20 ENCOUNTER — Ambulatory Visit: Payer: Medicare Other | Attending: Neurology

## 2017-11-20 DIAGNOSIS — F5101 Primary insomnia: Secondary | ICD-10-CM | POA: Diagnosis not present

## 2017-11-20 DIAGNOSIS — G4733 Obstructive sleep apnea (adult) (pediatric): Secondary | ICD-10-CM | POA: Diagnosis not present

## 2017-11-20 DIAGNOSIS — Z9189 Other specified personal risk factors, not elsewhere classified: Secondary | ICD-10-CM | POA: Diagnosis present

## 2017-11-21 ENCOUNTER — Ambulatory Visit: Payer: Medicare Other

## 2023-09-22 LAB — COLOGUARD: COLOGUARD: NEGATIVE
